# Patient Record
Sex: Male | Born: 1950 | Race: White | Hispanic: No | Marital: Married | Smoking: Current some day smoker
Health system: Southern US, Community
[De-identification: ages and names within clinical notes are randomized; demographics above are authoritative.]

## PROBLEM LIST (undated history)

## (undated) DIAGNOSIS — I9589 Other hypotension: ICD-10-CM

## (undated) DIAGNOSIS — K2100 Gastro-esophageal reflux disease with esophagitis, without bleeding: Secondary | ICD-10-CM

## (undated) DIAGNOSIS — I739 Peripheral vascular disease, unspecified: Secondary | ICD-10-CM

## (undated) DIAGNOSIS — F411 Generalized anxiety disorder: Secondary | ICD-10-CM

## (undated) DIAGNOSIS — K219 Gastro-esophageal reflux disease without esophagitis: Secondary | ICD-10-CM

## (undated) DIAGNOSIS — M7651 Patellar tendinitis, right knee: Secondary | ICD-10-CM

## (undated) HISTORY — DX: Gastro-esophageal reflux disease with esophagitis: K21.0

## (undated) HISTORY — PX: MASTOID DEBRIDEMENT: SHX2002

## (undated) HISTORY — DX: Peripheral vascular disease, unspecified: I73.9

## (undated) HISTORY — DX: Patellar tendinitis, right knee: M76.51

## (undated) HISTORY — DX: Generalized anxiety disorder: F41.1

## (undated) HISTORY — DX: Gastro-esophageal reflux disease with esophagitis, without bleeding: K21.00

---

## 2004-03-01 ENCOUNTER — Emergency Department (HOSPITAL_COMMUNITY): Admission: EM | Admit: 2004-03-01 | Discharge: 2004-03-01 | Payer: Self-pay | Admitting: Emergency Medicine

## 2016-11-25 MED ORDER — ESCITALOPRAM OXALATE 10 MG PO TAB
ORAL_TABLET | 11 refills | Status: DC
Start: 2016-11-25 — End: 2018-01-20

## 2017-01-04 ENCOUNTER — Encounter: Admit: 2017-01-04 | Discharge: 2017-01-04 | Payer: MEDICARE

## 2017-01-04 ENCOUNTER — Ambulatory Visit: Admit: 2017-01-04 | Discharge: 2017-01-04 | Payer: No Typology Code available for payment source

## 2017-01-04 DIAGNOSIS — G47419 Narcolepsy without cataplexy: Principal | ICD-10-CM

## 2017-01-04 DIAGNOSIS — R079 Chest pain, unspecified: ICD-10-CM

## 2017-01-04 DIAGNOSIS — R0600 Dyspnea, unspecified: ICD-10-CM

## 2017-01-04 DIAGNOSIS — E6609 Other obesity due to excess calories: ICD-10-CM

## 2017-01-04 DIAGNOSIS — N200 Calculus of kidney: ICD-10-CM

## 2017-01-04 DIAGNOSIS — R51 Headache: ICD-10-CM

## 2017-01-04 DIAGNOSIS — R1319 Other dysphagia: ICD-10-CM

## 2017-01-04 DIAGNOSIS — M1990 Unspecified osteoarthritis, unspecified site: ICD-10-CM

## 2017-01-04 DIAGNOSIS — G4733 Obstructive sleep apnea (adult) (pediatric): ICD-10-CM

## 2017-01-04 DIAGNOSIS — D689 Coagulation defect, unspecified: ICD-10-CM

## 2017-01-04 DIAGNOSIS — G43909 Migraine, unspecified, not intractable, without status migrainosus: ICD-10-CM

## 2017-01-04 DIAGNOSIS — K219 Gastro-esophageal reflux disease without esophagitis: ICD-10-CM

## 2017-01-04 DIAGNOSIS — G479 Sleep disorder, unspecified: ICD-10-CM

## 2017-01-04 DIAGNOSIS — R42 Dizziness and giddiness: ICD-10-CM

## 2017-01-04 DIAGNOSIS — E785 Hyperlipidemia, unspecified: ICD-10-CM

## 2017-01-04 MED ORDER — AMPHETAMINE SULFATE 10 MG PO TAB
1 | ORAL_TABLET | Freq: Every day | ORAL | 0 refills | Status: AC
Start: 2017-01-04 — End: 2017-01-04

## 2017-01-04 MED ORDER — AMPHETAMINE SULFATE 10 MG PO TAB
1 | ORAL_TABLET | Freq: Every day | ORAL | 0 refills | Status: AC
Start: 2017-01-04 — End: 2017-03-10

## 2017-01-04 NOTE — Progress Notes
Date of Service: 01/04/2017    Subjective:             Derrick King is a 66 y.o. male.    History of Present Illness    Last September was in [place] and needed more pressure.      BT 9-1 SOL no trouble  Wake up to 9 am.  Best sleeping time is 5 am - 10 am.  Awakenings couple times  Napping during the day, sometimes.  Dextrostat upon awakening 1 AM, then 2.5 hours alter second one,    Ambetter is for 3 months then on Medicare.         Review of Systems   Constitutional: Positive for fatigue.   HENT: Positive for congestion.    Respiratory: Positive for apnea and cough.    Neurological: Positive for numbness.   All other systems reviewed and are negative.        Objective:         ??? amphetamine sulfate (EVEKEO) 10 mg tab Take 1 tablet by mouth daily   ??? aspirin/acetaminophen/caffeine(+) (EXCEDRIN MIGRAINE) 250/250/65 mg tab Take 1 Tab by mouth every 8 hours as needed.   ??? atorvastatin (LIPITOR) 20 mg tablet Take 20 mg by mouth every 48 hours. at Bedtime   ??? Cholecalciferol (Vitamin D3) (VITAMIN D-3) 2,000 unit cap Take 2,000 Units by mouth daily.   ??? dextroamphetamine(+) (DEXTROSTAT) 10 mg tablet Take 1 tablet by mouth five times daily Earliest Fill Date: 12/24/16   ??? escitalopram oxalate (LEXAPRO) 10 mg tablet 1 po q day   ??? LORATADINE/PSEUDOEPHEDRINE (CLARITIN-D 24 HOUR PO) Take 1 Tab by mouth at bedtime daily.   ??? montelukast (SINGULAIR) 10 mg tablet Take 10 mg by mouth at bedtime daily.   ??? omeprazole DR(+) (PRILOSEC) 20 mg PO capsule Take 20 mg by mouth daily before dinner.   ??? phenylephrine (NEO-SYNEPHRINE) 0.25 % nasal spray Apply 2 Sprays to each nostril as directed every 6 hours as needed.   ??? tamsulosin (FLOMAX) 0.4 mg capsule Take 1 Cap by mouth daily. Take 30 min after same meal.  Do not cut/ crush/ chew.     Vitals:    01/04/17 1349   BP: 137/75   Pulse: 72   Weight: 121.1 kg (267 lb)   Height: 185.4 cm (73)     Body mass index is 35.23 kg/m???.     Physical Exam  Nose; patent  OP: Malampati 2 Neck: Neck supple.   Cardiovascular: Normal rate and regular rhythm.    Pulmonary/Chest: Effort normal and breath sounds normal.   Musculoskeletal: No edema.   Neurological: alert.   Skin: Skin is warm and dry.   Psychiatric: Normal mood and affect.         Assessment and Plan:    Problem   Narcolepsy    06/2015 mean latency 7 min, (4.6 on 4 naps), 1 SOREM on 2nd nap  Noted 05/2015 MSLT 18 min, no REM, PSG CPAP 10 cm, REM lat 65 min  ESS of 11 from 20 while on stimulants  Long hx of EDS, failed Nuvigil, Provigil, Adderall (80 mg),Amphetamine lingual dyskinesias, myoclonic jerks/tics  No sleep paralysis or hallucinations. Denies cataplexy but knees weak at times in the afternoon  No family hx    Noted 05/2015 MSLT 18 min, no REM, PSG CPAP 10 cm, REM lat 65 min, No sleep paralysis or hallucinations. Denies cataplexy but knees weak at times in the afternoon.No family hx of Narcolepsy.    -  ESS of 20-->11 while on stimulants  - Long hx of EDS, failed Nuvigil, Provigil, Adderall (80 mg),Amphetamine lingual dyskinesias, myoclonic jerks/tics--> some improvement on Concerta (Methyphenidate CR 36mg  BID )        Obesity    Discussed patient's BMI with him.  The body mass index is 34.84 kg/(m^2). and falls within the category of Obesity 2 (35 to <40); counseled regarding weight gain.     Osa On Cpap    Diagnosed in 2011  Titration study in 2014 with AHI of 79, Sleep efficeincy of 82%, REM latency 93 mins, No PLMD's  Titrated to 10 cm H20    Machine: [employer]   DME: [facility] in [place]          Narcolepsy  Dexedrine 10 mg up to 5 per day is only modestly improving alertness.  He is unable to afford long acting meds with his current insurance.  He feels this is a trend that things work for a while then stop working.  We will try Evekeo.  Discussed risks/benefits/SE/alternatives.  He tolerated Adderall before and this is similar.  He was given coupons and prescription enough for 30 days taking bid. Another consideration would be phentermine as a stimulant and weight loss agent.          OSA on CPAP  The patient continues to benefit from CPAP with more consolidated sleep, elimination of snoring and improved daytime alertness.    We discussed care and maintenance of machine, mask, accessories.  This includes:  Every month: filters and mask liners (if appropriate)  Every 3 months new mask  Every 6 months new headgear    No download to review.  Will request one from DME for review to ensure compliance and effective treatment.    Obesity  Discussed patient's BMI with him.  The body mass index is 35.23 kg/m???. and falls within the category of Obesity 1 (30  to <35); counseled regarding weight loss.  The patient is aware of the association between obesity and sleep apnea.    Depression Screening was performed on Decota Rudge in clinic today. Based on the score of 1, no follow up action or recommendations are necessary at this time.  The patient's Epworth Sleepiness Scale Score is 12/24.  STOPBANG Score: 6  If score > 3 there is a high probability that they have OSA.

## 2017-01-07 NOTE — Assessment & Plan Note
Discussed patient's BMI with him.  The body mass index is 35.23 kg/m. and falls within the category of Obesity 1 (30  to <35); counseled regarding weight loss.  The patient is aware of the association between obesity and sleep apnea.

## 2017-01-07 NOTE — Assessment & Plan Note
The patient continues to benefit from CPAP with more consolidated sleep, elimination of snoring and improved daytime alertness.    We discussed care and maintenance of machine, mask, accessories.  This includes:  Every month: filters and mask liners (if appropriate)  Every 3 months new mask  Every 6 months new headgear    No download to review.  Will request one from DME for review to ensure compliance and effective treatment.

## 2017-03-09 ENCOUNTER — Encounter: Admit: 2017-03-09 | Discharge: 2017-03-09 | Payer: MEDICARE

## 2017-03-09 MED ORDER — DEXTROAMPHETAMINE 10 MG PO TAB
10 mg | ORAL_TABLET | Freq: Every day | ORAL | 0 refills | Status: AC
Start: 2017-03-09 — End: 2017-03-10

## 2017-03-09 MED ORDER — DEXTROAMPHETAMINE 10 MG PO TAB
10 mg | ORAL_TABLET | Freq: Every day | ORAL | 0 refills | Status: AC
Start: 2017-03-09 — End: 2017-03-09

## 2017-03-09 NOTE — Telephone Encounter
Dr. Andria MeuseStevens wants to know if pt ever received the CPAP 10 cmH2O when we sent the order to [facility] on July 25th, 2016.  [facility] message to [place] [facility]. We will need to get a download either way.  [facility] old [facility] [place].    Theodoro Gristave at [facility] replied and said pt did pick up CPAP machine on 03/11/15.  Theodoro GristDave put pt in [employer] so we can see his data. Let Dr. Andria MeuseStevens know and also gave her 30 day download.

## 2017-03-10 ENCOUNTER — Encounter: Admit: 2017-03-10 | Discharge: 2017-03-10 | Payer: MEDICARE

## 2017-03-10 MED ORDER — DEXTROAMPHETAMINE 10 MG PO TAB
10 mg | ORAL_TABLET | Freq: Every day | ORAL | 0 refills | Status: AC
Start: 2017-03-10 — End: 2017-03-10

## 2017-03-10 MED ORDER — DEXTROAMPHETAMINE 10 MG PO TAB
10 mg | ORAL_TABLET | Freq: Every day | ORAL | 0 refills | Status: AC
Start: 2017-03-10 — End: 2017-07-22

## 2017-03-10 NOTE — Telephone Encounter
Derrick King tried to fill Rx for dextrostat 10 mg 1 tablet five times a day. Not printing out.  I printed 3 month out scripts for med.  Will mail out Rx to pt home address.

## 2017-07-22 ENCOUNTER — Encounter: Admit: 2017-07-22 | Discharge: 2017-07-22 | Payer: MEDICARE

## 2017-07-22 MED ORDER — DEXTROAMPHETAMINE 10 MG PO TAB
10 mg | ORAL_TABLET | Freq: Every day | ORAL | 0 refills | Status: AC
Start: 2017-07-22 — End: 2017-07-22

## 2017-07-22 MED ORDER — DEXTROAMPHETAMINE 10 MG PO TAB
10 mg | ORAL_TABLET | Freq: Every day | ORAL | 0 refills | Status: AC
Start: 2017-07-22 — End: 2017-09-06

## 2017-07-22 NOTE — Telephone Encounter
Refill request for Dextroamphetamine.  Per protocol refill approved and prescriptions will be mailed to Derrick King on Monday after Dr Andria MeuseStevens signs them. Derrick King notified.

## 2017-08-09 DIAGNOSIS — I7389 Other specified peripheral vascular diseases: Secondary | ICD-10-CM | POA: Diagnosis not present

## 2017-08-09 DIAGNOSIS — Z125 Encounter for screening for malignant neoplasm of prostate: Secondary | ICD-10-CM | POA: Diagnosis not present

## 2017-08-09 DIAGNOSIS — F411 Generalized anxiety disorder: Secondary | ICD-10-CM | POA: Diagnosis not present

## 2017-08-09 DIAGNOSIS — M7651 Patellar tendinitis, right knee: Secondary | ICD-10-CM | POA: Diagnosis not present

## 2017-08-09 DIAGNOSIS — K210 Gastro-esophageal reflux disease with esophagitis: Secondary | ICD-10-CM | POA: Diagnosis not present

## 2017-08-09 DIAGNOSIS — E7849 Other hyperlipidemia: Secondary | ICD-10-CM | POA: Diagnosis not present

## 2017-08-09 DIAGNOSIS — Z131 Encounter for screening for diabetes mellitus: Secondary | ICD-10-CM | POA: Diagnosis not present

## 2017-08-12 DIAGNOSIS — I7389 Other specified peripheral vascular diseases: Secondary | ICD-10-CM | POA: Diagnosis not present

## 2017-08-15 DIAGNOSIS — I739 Peripheral vascular disease, unspecified: Secondary | ICD-10-CM | POA: Diagnosis not present

## 2017-08-24 ENCOUNTER — Encounter: Payer: Self-pay | Admitting: Vascular Surgery

## 2017-09-06 ENCOUNTER — Ambulatory Visit: Admit: 2017-09-06 | Discharge: 2017-09-06 | Payer: MEDICARE

## 2017-09-06 ENCOUNTER — Encounter: Admit: 2017-09-06 | Discharge: 2017-09-06 | Payer: MEDICARE

## 2017-09-06 DIAGNOSIS — R1319 Other dysphagia: ICD-10-CM

## 2017-09-06 DIAGNOSIS — E785 Hyperlipidemia, unspecified: ICD-10-CM

## 2017-09-06 DIAGNOSIS — D689 Coagulation defect, unspecified: ICD-10-CM

## 2017-09-06 DIAGNOSIS — G4733 Obstructive sleep apnea (adult) (pediatric): ICD-10-CM

## 2017-09-06 DIAGNOSIS — I9589 Other hypotension: Principal | ICD-10-CM

## 2017-09-06 DIAGNOSIS — G43909 Migraine, unspecified, not intractable, without status migrainosus: ICD-10-CM

## 2017-09-06 DIAGNOSIS — R0600 Dyspnea, unspecified: ICD-10-CM

## 2017-09-06 DIAGNOSIS — K219 Gastro-esophageal reflux disease without esophagitis: ICD-10-CM

## 2017-09-06 DIAGNOSIS — R42 Dizziness and giddiness: ICD-10-CM

## 2017-09-06 DIAGNOSIS — R079 Chest pain, unspecified: ICD-10-CM

## 2017-09-06 DIAGNOSIS — M1990 Unspecified osteoarthritis, unspecified site: ICD-10-CM

## 2017-09-06 DIAGNOSIS — G479 Sleep disorder, unspecified: ICD-10-CM

## 2017-09-06 DIAGNOSIS — G47419 Narcolepsy without cataplexy: ICD-10-CM

## 2017-09-06 DIAGNOSIS — N200 Calculus of kidney: ICD-10-CM

## 2017-09-06 DIAGNOSIS — R51 Headache: ICD-10-CM

## 2017-09-06 MED ORDER — DEXTROAMPHETAMINE 10 MG PO TAB
10 mg | ORAL_TABLET | Freq: Every day | ORAL | 0 refills | Status: AC
Start: 2017-09-06 — End: 2018-08-15

## 2017-09-06 MED ORDER — DEXTROAMPHETAMINE 10 MG PO TAB
10 mg | ORAL_TABLET | Freq: Every day | ORAL | 0 refills | Status: AC
Start: 2017-09-06 — End: 2017-09-06

## 2017-09-29 ENCOUNTER — Encounter: Payer: Self-pay | Admitting: Vascular Surgery

## 2017-09-29 ENCOUNTER — Ambulatory Visit (INDEPENDENT_AMBULATORY_CARE_PROVIDER_SITE_OTHER): Payer: Medicare HMO | Admitting: Vascular Surgery

## 2017-09-29 VITALS — BP 120/61 | HR 73 | Temp 99.2°F | Resp 16 | Ht 69.0 in | Wt 183.0 lb

## 2017-09-29 DIAGNOSIS — I739 Peripheral vascular disease, unspecified: Secondary | ICD-10-CM | POA: Diagnosis not present

## 2017-09-29 DIAGNOSIS — I714 Abdominal aortic aneurysm, without rupture, unspecified: Secondary | ICD-10-CM

## 2017-09-29 NOTE — Progress Notes (Signed)
History of Present Illness:  Patient is a 67 y.o. year old male who presents for evaluation of thigh claudication L>R LE.  These symptoms started a few months ago with ambulation.  He could walk for about 15 min. Then stop and rest.  He reports no calf pain.  He denise history of non healing wounds and no family history of PAD. His primary care physician started him on Mobic daily and advised him to stop smoking.  He states the Mobic has helped and his thigh pain has gone away.     Past medical history includes: 50+ years of tobacco abuse, acid reflux and hypercholesterolemia managed with a statin.     Past Medical History:  Diagnosis Date  . Anxiety neurosis   . Patellar tendonitis of right knee   . Peripheral vascular disease (HCC)   . Reflux esophagitis     History reviewed. No pertinent surgical history.  ROS:   General:  No weight loss, Fever, chills  HEENT: No recent headaches, no nasal bleeding, no visual changes, no sore throat  Neurologic: No dizziness, blackouts, seizures. No recent symptoms of stroke or mini- stroke. No recent episodes of slurred speech, or temporary blindness.  Cardiac: No recent episodes of chest pain/pressure, no shortness of breath at rest.  No shortness of breath with exertion.  Denies history of atrial fibrillation or irregular heartbeat  Vascular: No history of rest pain in feet.  No history of claudication.  No history of non-healing ulcer, No history of DVT   Pulmonary: No home oxygen, no productive cough, no hemoptysis,  No asthma or wheezing  Musculoskeletal:  [ ]  Arthritis, [ ]  Low back pain,  [x ] Joint pain  Hematologic:No history of hypercoagulable state.  No history of easy bleeding.  No history of anemia  Gastrointestinal: No hematochezia or melena,  No gastroesophageal reflux, no trouble swallowing  Urinary: [ ]  chronic Kidney disease, [ ]  on HD - [ ]  MWF or [ ]  TTHS, [ ]  Burning with urination, [ ]  Frequent urination, [ ]   Difficulty urinating;   Skin: No rashes  Psychological: No history of anxiety,  No history of depression  Social History Social History   Tobacco Use  . Smoking status: Current Every Day Smoker    Packs/day: 1.00  . Smokeless tobacco: Never Used  Substance Use Topics  . Alcohol use: No    Frequency: Never  . Drug use: No    Family History History reviewed. No pertinent family history.  Allergies  No Known Allergies   Current Outpatient Medications  Medication Sig Dispense Refill  . atorvastatin (LIPITOR) 20 MG tablet Take 20 mg by mouth daily.    . busPIRone (BUSPAR) 15 MG tablet Take 15 mg by mouth 2 (two) times daily.    . meloxicam (MOBIC) 7.5 MG tablet Take 7.5 mg by mouth daily.    Marland Kitchen omeprazole (PRILOSEC) 20 MG capsule Take 20 mg by mouth daily.    . ranitidine (ZANTAC) 150 MG tablet Take 150 mg by mouth 2 (two) times daily.     No current facility-administered medications for this visit.     Physical Examination  Vitals:   09/29/17 1458  BP: 120/61  Pulse: 73  Resp: 16  Temp: 99.2 F (37.3 C)  TempSrc: Oral  SpO2: 97%  Weight: 183 lb (83 kg)  Height: 5\' 9"  (1.753 m)    Body mass index is 27.02 kg/m.  General:  Alert and oriented, no acute  distress HEENT: Normal Neck: No bruit or JVD Pulmonary: Clear to auscultation bilaterally Cardiac: Regular Rate and Rhythm without murmur Abdomen: Soft, non-tender, non-distended, no mass, no scars, easily palpable aortic pulse Skin: No rash Extremity Pulses:  2+ radial, brachial, femoral, dorsalis pedis, posterior tibial pulses, non palpable left pedal pulse and weak left femoral pulse. 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  He continues to smoke daily and is not interested in quitting right now.    If his symptoms return or increase we will schedule him for an angiogram to better view his arterial flow.  He will follow up in 6 months for repeat ABI's.     In the mean time we will have him return in 1 week for an aortic ultrasound to investigate his possible AAA and get a baseline measurement.     Mosetta PigeonEmma Maureen Collins PA-C [facility]  The patient was seen in conjunction with Dr. Darrick PennaFields today  History and exam findings as above.  I am unable to palpate his left femoral pulse.  He does have a very prominent aortic pulsation and most likely has an infrarenal abdominal aortic aneurysm.  It may be as large as 4 cm.  We will obtain an aortic ultrasound in the next few weeks to make sure that this is fully evaluated.  As far as his left thigh symptoms are concerned.  The patient is not currently interested in an intervention.  I discussed with him that if his thigh and buttock symptoms return that we could consider an arteriogram possible intervention but would also need to make sure that if he does have an aneurysm we take this into consideration as well.  He will follow-up with me after his aortic ultrasound.  It was emphasized to the patient today to try to quit smoking.  Fabienne Brunsharles Eleri Ruben, MD [facility] Office: [phone] Pager: [phone]

## 2017-09-30 ENCOUNTER — Other Ambulatory Visit: Payer: Self-pay

## 2017-09-30 ENCOUNTER — Encounter: Payer: Self-pay | Admitting: Internal Medicine

## 2017-09-30 DIAGNOSIS — I714 Abdominal aortic aneurysm, without rupture, unspecified: Secondary | ICD-10-CM

## 2017-09-30 DIAGNOSIS — I739 Peripheral vascular disease, unspecified: Secondary | ICD-10-CM

## 2017-10-04 ENCOUNTER — Ambulatory Visit (HOSPITAL_COMMUNITY)
Admission: RE | Admit: 2017-10-04 | Discharge: 2017-10-04 | Disposition: A | Discharge status: Home / Self Care | Payer: Medicare HMO | Source: Ambulatory Visit | Attending: Vascular Surgery | Admitting: Vascular Surgery

## 2017-10-04 DIAGNOSIS — F17200 Nicotine dependence, unspecified, uncomplicated: Secondary | ICD-10-CM | POA: Diagnosis not present

## 2017-10-04 DIAGNOSIS — R9389 Abnormal findings on diagnostic imaging of other specified body structures: Secondary | ICD-10-CM | POA: Insufficient documentation

## 2017-10-04 DIAGNOSIS — I714 Abdominal aortic aneurysm, without rupture, unspecified: Secondary | ICD-10-CM

## 2017-10-05 ENCOUNTER — Ambulatory Visit: Payer: Medicare HMO

## 2017-10-19 ENCOUNTER — Other Ambulatory Visit: Payer: Self-pay

## 2017-10-19 ENCOUNTER — Ambulatory Visit (INDEPENDENT_AMBULATORY_CARE_PROVIDER_SITE_OTHER): Payer: Medicare HMO | Admitting: Physician Assistant

## 2017-10-19 VITALS — BP 116/72 | HR 68 | Resp 20 | Ht 69.0 in | Wt 182.0 lb

## 2017-10-19 DIAGNOSIS — I714 Abdominal aortic aneurysm, without rupture, unspecified: Secondary | ICD-10-CM

## 2017-10-19 DIAGNOSIS — I739 Peripheral vascular disease, unspecified: Secondary | ICD-10-CM | POA: Diagnosis not present

## 2017-10-19 NOTE — Progress Notes (Signed)
  POST OPERATIVE OFFICE NOTE    CC:  F/u for surgery  HPI:  This is a 67 y.o. male who is here for a follow up visit .  He was first seen in our office for left LE thigh pain with abnormal ABI's.  Right 0.99 TBI 0.51, Left 0.48 TBI 0.34  His symptoms have resolved and he can walk as far as he wants without left thigh pain.  During his examination it was discovered that he had a palpable aortic pulse.  He was sent for an aortic duplex.  He is here today to review the results.    He reports no abdominal or lumbar pain.  His left thigh pain has resolved since he started daily Mobic prescribed by his PCP.  He denise fever, chills, N/V/D.   Past medical history includes: 50+ years of tobacco abuse, acid reflux, PAD, and hypercholesterolemia managed with a statin.      No Known Allergies  Current Outpatient Medications  Medication Sig Dispense Refill  . atorvastatin (LIPITOR) 20 MG tablet Take 20 mg by mouth daily.    . busPIRone (BUSPAR) 15 MG tablet Take 15 mg by mouth 2 (two) times daily.    . meloxicam (MOBIC) 7.5 MG tablet Take 7.5 mg by mouth daily.    Marland Kitchen. omeprazole (PRILOSEC) 20 MG capsule Take 20 mg by mouth daily.    . ranitidine (ZANTAC) 150 MG tablet Take 150 mg by mouth 2 (two) times daily.     No current facility-administered medications for this visit.      ROS:  See HPI  Physical Exam:  Vitals:   10/19/17 1435  BP: 116/72  Pulse: 68  Resp: 20  SpO2: 96%   General:  Alert and oriented, no acute distress HEENT: Normal Neck: No bruit or JVD Pulmonary: Clear to auscultation bilaterally Cardiac: Regular Rate and Rhythm without murmur Abdomen: Soft, non-tender, non-distended, no mass, no scars, easily palpable aortic pulse Skin: No rash Extremity Pulses:  2+ radial, brachial, femoral, dorsalis pedis, posterior tibial pulses, non palpable left pedal pulse and weak left femoral pulse. Musculoskeletal: No deformity or edema      Neurologic: Upper and lower extremity motor  5/5 and symmetric  His exam has not changed from previous visit.  Ultrasound 10/04/2017 Reveals AAA with max diameter of 6.0cm   Assessment/Plan:  This is a 67 y.o. male who is here for follow up of asymptomatic AAA duplex.  By Ultrasound he has a 6 cm AAA.  At this time we will schedule him for a CTA chest/Abdomin/Pelvis and referral to cardiologist for cardiac clearance in preporation for likely aortic aneurysm repair in the near future.  He is asymptomatic this was found on routine exam in our office 09/29/2017.   He will follow up with Dr. Darrick PennaFields after the CTA.   Mosetta PigeonEmma Maureen Charnel Giles PA-C [facility] [phone]  Clinic MD:  Imogene Burnhen

## 2017-10-20 ENCOUNTER — Other Ambulatory Visit: Payer: Self-pay

## 2017-10-20 DIAGNOSIS — I713 Abdominal aortic aneurysm, ruptured, unspecified: Secondary | ICD-10-CM

## 2017-11-01 ENCOUNTER — Other Ambulatory Visit: Payer: Self-pay

## 2017-11-01 DIAGNOSIS — Z01818 Encounter for other preprocedural examination: Secondary | ICD-10-CM

## 2017-11-04 ENCOUNTER — Encounter (HOSPITAL_COMMUNITY): Payer: Self-pay

## 2017-11-04 ENCOUNTER — Ambulatory Visit (HOSPITAL_COMMUNITY)
Admission: RE | Admit: 2017-11-04 | Discharge: 2017-11-04 | Disposition: A | Discharge status: Home / Self Care | Payer: Medicare HMO | Source: Ambulatory Visit | Attending: Vascular Surgery | Admitting: Vascular Surgery

## 2017-11-04 DIAGNOSIS — I2510 Atherosclerotic heart disease of native coronary artery without angina pectoris: Secondary | ICD-10-CM | POA: Diagnosis not present

## 2017-11-04 DIAGNOSIS — K449 Diaphragmatic hernia without obstruction or gangrene: Secondary | ICD-10-CM | POA: Diagnosis not present

## 2017-11-04 DIAGNOSIS — I723 Aneurysm of iliac artery: Secondary | ICD-10-CM | POA: Insufficient documentation

## 2017-11-04 DIAGNOSIS — J438 Other emphysema: Secondary | ICD-10-CM | POA: Diagnosis not present

## 2017-11-04 DIAGNOSIS — I745 Embolism and thrombosis of iliac artery: Secondary | ICD-10-CM | POA: Insufficient documentation

## 2017-11-04 DIAGNOSIS — I700 Atherosclerosis of aorta: Secondary | ICD-10-CM | POA: Diagnosis not present

## 2017-11-04 DIAGNOSIS — J432 Centrilobular emphysema: Secondary | ICD-10-CM | POA: Diagnosis not present

## 2017-11-04 DIAGNOSIS — I719 Aortic aneurysm of unspecified site, without rupture: Secondary | ICD-10-CM | POA: Diagnosis not present

## 2017-11-04 DIAGNOSIS — I713 Abdominal aortic aneurysm, ruptured, unspecified: Secondary | ICD-10-CM

## 2017-11-04 DIAGNOSIS — K5790 Diverticulosis of intestine, part unspecified, without perforation or abscess without bleeding: Secondary | ICD-10-CM | POA: Diagnosis not present

## 2017-11-04 DIAGNOSIS — K8020 Calculus of gallbladder without cholecystitis without obstruction: Secondary | ICD-10-CM | POA: Insufficient documentation

## 2017-11-04 DIAGNOSIS — I714 Abdominal aortic aneurysm, without rupture: Secondary | ICD-10-CM | POA: Diagnosis not present

## 2017-11-04 LAB — POCT I-STAT CREATININE: Creatinine, Ser: 1 mg/dL (ref 0.61–1.24)

## 2017-11-04 MED ORDER — IOPAMIDOL (ISOVUE-370) INJECTION 76%
100.0000 mL | Freq: Once | INTRAVENOUS | Status: AC | PRN
  Administered 2017-11-04: 100 mL via INTRAVENOUS

## 2017-11-07 ENCOUNTER — Encounter: Payer: Self-pay | Admitting: Cardiology

## 2017-11-07 ENCOUNTER — Encounter: Payer: Self-pay | Admitting: *Deleted

## 2017-11-07 ENCOUNTER — Ambulatory Visit (INDEPENDENT_AMBULATORY_CARE_PROVIDER_SITE_OTHER): Payer: Medicare HMO | Admitting: Cardiology

## 2017-11-07 VITALS — BP 140/88 | HR 62 | Ht 69.0 in | Wt 184.4 lb

## 2017-11-07 DIAGNOSIS — I714 Abdominal aortic aneurysm, without rupture, unspecified: Secondary | ICD-10-CM | POA: Insufficient documentation

## 2017-11-07 DIAGNOSIS — I2510 Atherosclerotic heart disease of native coronary artery without angina pectoris: Secondary | ICD-10-CM | POA: Diagnosis not present

## 2017-11-07 DIAGNOSIS — Z01810 Encounter for preprocedural cardiovascular examination: Secondary | ICD-10-CM

## 2017-11-07 NOTE — Progress Notes (Addendum)
     Clinical Summary Donald Poole is a 67 y.o.male seen as new consult, referred by Dr Jettie BoozeField for preoperative evaluation.   1. Preoperative evaluation - 09/2017 AAA US with 6 cm AAA - 10/2017 CTA with 6.5 cm AAA, PAD, LM and 3 vessel CAD - no chest pain. No significant SOB/DOE, though sedentary lifestyle   2. PAD - followed by vascular.     Past Medical History:  Diagnosis Date  . Anxiety neurosis   . Patellar tendonitis of right knee   . Peripheral vascular disease (HCC)   . Reflux esophagitis      No Known Allergies   Current Outpatient Medications  Medication Sig Dispense Refill  . atorvastatin (LIPITOR) 20 MG tablet Take 20 mg by mouth daily.    . busPIRone (BUSPAR) 15 MG tablet Take 15 mg by mouth 2 (two) times daily.    . meloxicam (MOBIC) 7.5 MG tablet Take 7.5 mg by mouth daily.    Marland Kitchen. omeprazole (PRILOSEC) 20 MG capsule Take 20 mg by mouth daily.    . ranitidine (ZANTAC) 150 MG tablet Take 150 mg by mouth 2 (two) times daily.     No current facility-administered medications for this visit.      No past surgical history on file.   No Known Allergies    No family history on file.   Social History Donald Poole reports that he has been smoking.  He has been smoking about 1.00 pack per day. He has never used smokeless tobacco. Donald Poole reports that he does not drink alcohol.   Review of Systems CONSTITUTIONAL: No weight loss, fever, chills, weakness or fatigue.  HEENT: Eyes: No visual loss, blurred vision, double vision or yellow sclerae.No hearing loss, sneezing, congestion, runny nose or sore throat.  SKIN: No rash or itching.  CARDIOVASCULAR: per hpi RESPIRATORY: per hpi GASTROINTESTINAL: No anorexia, nausea, vomiting or diarrhea. No abdominal pain or blood.  GENITOURINARY: No burning on urination, no polyuria NEUROLOGICAL: No headache, dizziness, syncope, paralysis, ataxia, numbness or tingling in the extremities. No change in bowel or bladder control.    MUSCULOSKELETAL: No muscle, back pain, joint pain or stiffness.  LYMPHATICS: No enlarged nodes. No history of splenectomy.  PSYCHIATRIC: No history of depression or anxiety.  ENDOCRINOLOGIC: No reports of sweating, cold or heat intolerance. No polyuria or polydipsia.  Marland Kitchen.   Physical Examination Vitals:   11/07/17 1427  BP: 140/88  Pulse: 62  SpO2: 98%   Vitals:   11/07/17 1427  Weight: 184 lb 6.4 oz (83.6 kg)  Height: 5\' 9"  (1.753 m)    Gen: resting comfortably, no acute distress HEENT: no scleral icterus, pupils equal round and reactive, no palptable cervical adenopathy,  CV: RRR, no m/r/g, no jvd Resp: Clear to auscultation bilaterally GI: abdomen is soft, non-tender, non-distended, normal bowel sounds, no hepatosplenomegaly MSK: extremities are warm, no edema.  Skin: warm, no rash Neuro:  no focal deficits Psych: appropriate affect    Assessment and Plan  1. Preoperative evaluation/CAD - being considered for vascular surgery - noted CAD and LM disease by recent CT scan. Unclear functionality of these lesions - we will plan for an exercise nuclear stress to further evaluate and better risk stratify for surgery - start ASA 81mg  daily.      11/16/17 Addendum Low risk stress test, recommend proceeding with surgery  Antoine PocheJonathan F. Edithe Dobbin, M.D.,

## 2017-11-07 NOTE — Patient Instructions (Signed)
Your physician recommends that you schedule a follow-up appointment in: [facility]  Your physician has recommended you make the following change in your medication:   START ASPRIN 81 MG DAILY  Your physician has requested that you have en exercise stress myoview. For further information please visit [email or web address]. Please follow instruction sheet, as given.  Thank you for choosing [facility]!!

## 2017-11-08 ENCOUNTER — Ambulatory Visit (INDEPENDENT_AMBULATORY_CARE_PROVIDER_SITE_OTHER): Payer: Medicare HMO | Admitting: Vascular Surgery

## 2017-11-08 ENCOUNTER — Encounter: Payer: Self-pay | Admitting: Vascular Surgery

## 2017-11-08 ENCOUNTER — Other Ambulatory Visit: Payer: Self-pay

## 2017-11-08 VITALS — BP 145/75 | HR 59 | Resp 20 | Ht 69.0 in | Wt 185.0 lb

## 2017-11-08 DIAGNOSIS — I714 Abdominal aortic aneurysm, without rupture, unspecified: Secondary | ICD-10-CM

## 2017-11-08 NOTE — H&P (View-Only) (Signed)
Patient name: Donald Poole MRN: [id number] DOB: Apr 09, 1951 Sex: male   HPI: Donald Poole is a 67 y.o. male who returns today for follow-up after CT Angio of the chest abdomen and pelvis.  It was done for further evaluation of a 6 cm abdominal aortic aneurysm.  Continues to deny any abdominal or back pain.  He was originally seen for left thigh claudication.  He continues to smoke but is considering quitting.  He was recently seen by cardiology and has a stress test scheduled for Monday, April 22.    Past Medical History:  Diagnosis Date  . Anxiety neurosis   . Patellar tendonitis of right knee   . Peripheral vascular disease (HCC)   . Reflux esophagitis    History reviewed. No pertinent surgical history.  History reviewed. No pertinent family history.  SOCIAL HISTORY: Social History   Socioeconomic History  . Marital status: Married    Spouse name: Not on file  . Number of children: Not on file  . Years of education: Not on file  . Highest education level: Not on file  Occupational History  . Not on file  Social Needs  . Financial resource strain: Not on file  . Food insecurity:    Worry: Not on file    Inability: Not on file  . Transportation needs:    Medical: Not on file    Non-medical: Not on file  Tobacco Use  . Smoking status: Current Every Day Smoker    Packs/day: 1.00  . Smokeless tobacco: Never Used  Substance and Sexual Activity  . Alcohol use: No    Frequency: Never  . Drug use: No  . Sexual activity: Not on file  Lifestyle  . Physical activity:    Days per week: Not on file    Minutes per session: Not on file  . Stress: Not on file  Relationships  . Social connections:    Talks on phone: Not on file    Gets together: Not on file    Attends religious service: Not on file    Active member of club or organization: Not on file    Attends meetings of clubs or organizations: Not on file    Relationship status: Not on file  . Intimate partner  violence:    Fear of current or ex partner: Not on file    Emotionally abused: Not on file    Physically abused: Not on file    Forced sexual activity: Not on file  Other Topics Concern  . Not on file  Social History Narrative  . Not on file    No Known Allergies  Current Outpatient Medications  Medication Sig Dispense Refill  . aspirin EC 81 MG tablet Take 81 mg by mouth daily.    Marland Kitchen atorvastatin (LIPITOR) 20 MG tablet Take 20 mg by mouth daily.    . busPIRone (BUSPAR) 15 MG tablet Take 15 mg by mouth 2 (two) times daily.    . meloxicam (MOBIC) 7.5 MG tablet Take 7.5 mg by mouth daily.    Marland Kitchen omeprazole (PRILOSEC) 20 MG capsule Take 20 mg by mouth daily.    . ranitidine (ZANTAC) 150 MG tablet Take 150 mg by mouth 2 (two) times daily.     No current facility-administered medications for this visit.     ROS:   General:  No weight loss, Fever, chills  HEENT: No recent headaches, no nasal bleeding, no visual changes, no sore throat  Neurologic: No dizziness, blackouts, seizures. No recent symptoms of stroke or mini- stroke. No recent episodes of slurred speech, or temporary blindness.  Cardiac: No recent episodes of chest pain/pressure, no shortness of breath at rest.  + shortness of breath with exertion.  Denies history of atrial fibrillation or irregular heartbeat  Vascular: No history of rest pain in feet.  + history of claudication.  No history of non-healing ulcer, No history of DVT   Pulmonary: No home oxygen, no productive cough, no hemoptysis,  No asthma or wheezing  Musculoskeletal:  [X]  Arthritis, [ ]  Low back pain,  [X]  Joint pain  Hematologic:No history of hypercoagulable state.  No history of easy bleeding.  No history of anemia  Gastrointestinal: No hematochezia or melena,  No gastroesophageal reflux, no trouble swallowing  Urinary: [ ]  chronic Kidney disease, [ ]  on HD - [ ]  MWF or [ ]  TTHS, [ ]  Burning with urination, [ ]  Frequent urination, [ ]  Difficulty  urinating;   Skin: No rashes  Psychological: + history of anxiety,  No history of depression   Physical Examination  Vitals:   11/08/17 0828 11/08/17 0829  BP: (!) 144/77 (!) 145/75  Pulse: (!) 59   Resp: 20   SpO2: 98%   Weight: 185 lb (83.9 kg)   Height: 5\' 9"  (1.753 m)     Body mass index is 27.32 kg/m.  General:  Alert and oriented, no acute distress HEENT: Normal Neck: No bruit or JVD Pulmonary: Clear to auscultation bilaterally Cardiac: Regular Rate and Rhythm without murmur Abdomen: Soft, non-tender, non-distended, vaguely palpable pulsatile mass, no scars Skin: No rash Extremity Pulses:  2+ radial, brachial, femoral, absent dorsalis pedis bilaterally, 2+ right absent left posterior tibial pulses bilaterally Musculoskeletal: No deformity or edema  Neurologic: Upper and lower extremity motor 5/5 and symmetric  DATA:  I reviewed the patient's CT Angio today.  Of note in his chest he had an presence of emphysema as well as coronary calcifications suggesting three-vessel possibly left main disease.  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  There appears to be enough clamp space above the renals and below the superior mesenteric artery.  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  The left common iliac is occluded in the left common femoral reconstitutes by collaterals.  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  On my review of the imaging I do not believe that he has a fenestrated stent graft candidate due to the tortuosity of the left renal.  He is not a candidate for standard infrarenal stent graft due to the short aortic neck.  I believe he is going to require standard operative open repair.  I discussed with him today that possibility.  He has a stress test  pending.  We will review the results of this.  I quoted him today 50% chance of transfusion perioperatively, 5% chance of cardiac events or death.  Other possibilities of postoperative renal failure or pneumonia.  Small overall risk of infection.   PLAN: Open repair of juxtarenal abdominal aortic aneurysm with plan to go to the right common iliac and left common femoral artery most likely not addressing the renal artery stenosis since his blood pressure is well controlled and he has no decline in renal function.  We  will schedule him for this as soon as we get the results of his stress test.  He will try to quit smoking at least 2 weeks prior to his operative repair so that his lungs are in better shape for the operation.   Fabienne Brunsharles Safiyah Cisney, MD [facility] Office: [phone] Pager: [phone]

## 2017-11-08 NOTE — Progress Notes (Signed)
Patient name: Donald Poole MRN: [id number] DOB: Apr 09, 1951 Sex: male   HPI: Donald Poole is a 67 y.o. male who returns today for follow-up after CT Angio of the chest abdomen and pelvis.  It was done for further evaluation of a 6 cm abdominal aortic aneurysm.  Continues to deny any abdominal or back pain.  He was originally seen for left thigh claudication.  He continues to smoke but is considering quitting.  He was recently seen by cardiology and has a stress test scheduled for Monday, April 22.    Past Medical History:  Diagnosis Date  . Anxiety neurosis   . Patellar tendonitis of right knee   . Peripheral vascular disease (HCC)   . Reflux esophagitis    History reviewed. No pertinent surgical history.  History reviewed. No pertinent family history.  SOCIAL HISTORY: Social History   Socioeconomic History  . Marital status: Married    Spouse name: Not on file  . Number of children: Not on file  . Years of education: Not on file  . Highest education level: Not on file  Occupational History  . Not on file  Social Needs  . Financial resource strain: Not on file  . Food insecurity:    Worry: Not on file    Inability: Not on file  . Transportation needs:    Medical: Not on file    Non-medical: Not on file  Tobacco Use  . Smoking status: Current Every Day Smoker    Packs/day: 1.00  . Smokeless tobacco: Never Used  Substance and Sexual Activity  . Alcohol use: No    Frequency: Never  . Drug use: No  . Sexual activity: Not on file  Lifestyle  . Physical activity:    Days per week: Not on file    Minutes per session: Not on file  . Stress: Not on file  Relationships  . Social connections:    Talks on phone: Not on file    Gets together: Not on file    Attends religious service: Not on file    Active member of club or organization: Not on file    Attends meetings of clubs or organizations: Not on file    Relationship status: Not on file  . Intimate partner  violence:    Fear of current or ex partner: Not on file    Emotionally abused: Not on file    Physically abused: Not on file    Forced sexual activity: Not on file  Other Topics Concern  . Not on file  Social History Narrative  . Not on file    No Known Allergies  Current Outpatient Medications  Medication Sig Dispense Refill  . aspirin EC 81 MG tablet Take 81 mg by mouth daily.    Marland Kitchen atorvastatin (LIPITOR) 20 MG tablet Take 20 mg by mouth daily.    . busPIRone (BUSPAR) 15 MG tablet Take 15 mg by mouth 2 (two) times daily.    . meloxicam (MOBIC) 7.5 MG tablet Take 7.5 mg by mouth daily.    Marland Kitchen omeprazole (PRILOSEC) 20 MG capsule Take 20 mg by mouth daily.    . ranitidine (ZANTAC) 150 MG tablet Take 150 mg by mouth 2 (two) times daily.     No current facility-administered medications for this visit.     ROS:   General:  No weight loss, Fever, chills  HEENT: No recent headaches, no nasal bleeding, no visual changes, no sore throat  Neurologic: No dizziness, blackouts, seizures. No recent symptoms of stroke or mini- stroke. No recent episodes of slurred speech, or temporary blindness.  Cardiac: No recent episodes of chest pain/pressure, no shortness of breath at rest.  + shortness of breath with exertion.  Denies history of atrial fibrillation or irregular heartbeat  Vascular: No history of rest pain in feet.  + history of claudication.  No history of non-healing ulcer, No history of DVT   Pulmonary: No home oxygen, no productive cough, no hemoptysis,  No asthma or wheezing  Musculoskeletal:  [X]  Arthritis, [ ]  Low back pain,  [X]  Joint pain  Hematologic:No history of hypercoagulable state.  No history of easy bleeding.  No history of anemia  Gastrointestinal: No hematochezia or melena,  No gastroesophageal reflux, no trouble swallowing  Urinary: [ ]  chronic Kidney disease, [ ]  on HD - [ ]  MWF or [ ]  TTHS, [ ]  Burning with urination, [ ]  Frequent urination, [ ]  Difficulty  urinating;   Skin: No rashes  Psychological: + history of anxiety,  No history of depression   Physical Examination  Vitals:   11/08/17 0828 11/08/17 0829  BP: (!) 144/77 (!) 145/75  Pulse: (!) 59   Resp: 20   SpO2: 98%   Weight: 185 lb (83.9 kg)   Height: 5\' 9"  (1.753 m)     Body mass index is 27.32 kg/m.  General:  Alert and oriented, no acute distress HEENT: Normal Neck: No bruit or JVD Pulmonary: Clear to auscultation bilaterally Cardiac: Regular Rate and Rhythm without murmur Abdomen: Soft, non-tender, non-distended, vaguely palpable pulsatile mass, no scars Skin: No rash Extremity Pulses:  2+ radial, brachial, femoral, absent dorsalis pedis bilaterally, 2+ right absent left posterior tibial pulses bilaterally Musculoskeletal: No deformity or edema  Neurologic: Upper and lower extremity motor 5/5 and symmetric  DATA:  I reviewed the patient's CT Angio today.  Of note in his chest he had an presence of emphysema as well as coronary calcifications suggesting three-vessel possibly left main disease.  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  There appears to be enough clamp space above the renals and below the superior mesenteric artery.  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  The left common iliac is occluded in the left common femoral reconstitutes by collaterals.  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  On my review of the imaging I do not believe that he has a fenestrated stent graft candidate due to the tortuosity of the left renal.  He is not a candidate for standard infrarenal stent graft due to the short aortic neck.  I believe he is going to require standard operative open repair.  I discussed with him today that possibility.  He has a stress test  pending.  We will review the results of this.  I quoted him today 50% chance of transfusion perioperatively, 5% chance of cardiac events or death.  Other possibilities of postoperative renal failure or pneumonia.  Small overall risk of infection.   PLAN: Open repair of juxtarenal abdominal aortic aneurysm with plan to go to the right common iliac and left common femoral artery most likely not addressing the renal artery stenosis since his blood pressure is well controlled and he has no decline in renal function.  We  will schedule him for this as soon as we get the results of his stress test.  He will try to quit smoking at least 2 weeks prior to his operative repair so that his lungs are in better shape for the operation.   Fabienne Brunsharles Amman Bartel, MD [facility] Office: [phone] Pager: [phone]

## 2017-11-10 DIAGNOSIS — E7849 Other hyperlipidemia: Secondary | ICD-10-CM | POA: Diagnosis not present

## 2017-11-10 DIAGNOSIS — K210 Gastro-esophageal reflux disease with esophagitis: Secondary | ICD-10-CM | POA: Diagnosis not present

## 2017-11-10 DIAGNOSIS — I7389 Other specified peripheral vascular diseases: Secondary | ICD-10-CM | POA: Diagnosis not present

## 2017-11-10 DIAGNOSIS — E781 Pure hyperglyceridemia: Secondary | ICD-10-CM | POA: Diagnosis not present

## 2017-11-10 DIAGNOSIS — F411 Generalized anxiety disorder: Secondary | ICD-10-CM | POA: Diagnosis not present

## 2017-11-10 DIAGNOSIS — M7651 Patellar tendinitis, right knee: Secondary | ICD-10-CM | POA: Diagnosis not present

## 2017-11-11 ENCOUNTER — Encounter: Payer: Self-pay | Admitting: Cardiology

## 2017-11-14 ENCOUNTER — Encounter (HOSPITAL_COMMUNITY): Payer: Self-pay

## 2017-11-14 ENCOUNTER — Encounter (HOSPITAL_BASED_OUTPATIENT_CLINIC_OR_DEPARTMENT_OTHER)
Admission: RE | Admit: 2017-11-14 | Discharge: 2017-11-14 | Disposition: A | Discharge status: Home / Self Care | Payer: Medicare HMO | Source: Ambulatory Visit | Attending: Cardiology | Admitting: Cardiology

## 2017-11-14 ENCOUNTER — Ambulatory Visit (HOSPITAL_COMMUNITY)
Admission: RE | Admit: 2017-11-14 | Discharge: 2017-11-14 | Disposition: A | Discharge status: Home / Self Care | Payer: Medicare HMO | Source: Ambulatory Visit | Attending: Cardiology | Admitting: Cardiology

## 2017-11-14 DIAGNOSIS — I2510 Atherosclerotic heart disease of native coronary artery without angina pectoris: Secondary | ICD-10-CM | POA: Insufficient documentation

## 2017-11-14 LAB — NM MYOCAR MULTI W/SPECT W/WALL MOTION / EF
CHL CUP MPHR: 155 {beats}/min
CHL CUP NUCLEAR SDS: 1
CHL CUP NUCLEAR SRS: 0
CHL CUP NUCLEAR SSS: 1
CSEPED: 6 min
CSEPEW: 7 METS
CSEPHR: 81 %
Exercise duration (sec): 16 s
LV dias vol: 90 mL (ref 62–150)
LV sys vol: 39 mL
NUC STRESS TID: 0.89
Peak HR: 126 {beats}/min
RATE: 0.47
RPE: 15
Rest HR: 51 {beats}/min

## 2017-11-14 MED ORDER — SODIUM CHLORIDE 0.9% FLUSH
INTRAVENOUS | Status: AC
  Administered 2017-11-14: 10 mL via INTRAVENOUS
  Filled 2017-11-14: qty 10

## 2017-11-14 MED ORDER — TECHNETIUM TC 99M TETROFOSMIN IV KIT
10.0000 | PACK | Freq: Once | INTRAVENOUS | Status: AC | PRN
  Administered 2017-11-14: 10.3 via INTRAVENOUS

## 2017-11-14 MED ORDER — REGADENOSON 0.4 MG/5ML IV SOLN
INTRAVENOUS | Status: AC
  Administered 2017-11-14: 0.4 mg via INTRAVENOUS
  Filled 2017-11-14: qty 5

## 2017-11-14 MED ORDER — TECHNETIUM TC 99M TETROFOSMIN IV KIT
30.0000 | PACK | Freq: Once | INTRAVENOUS | Status: AC | PRN
Start: 2017-11-14 — End: 2017-11-14
  Administered 2017-11-14: 30 via INTRAVENOUS

## 2017-11-16 ENCOUNTER — Ambulatory Visit (HOSPITAL_COMMUNITY): Payer: Medicare HMO

## 2017-11-21 ENCOUNTER — Other Ambulatory Visit: Payer: Self-pay | Admitting: *Deleted

## 2017-11-24 NOTE — Pre-Procedure Instructions (Signed)
Donald Poole  11/24/2017      [facility] - [place], [place] - [place], [place] - [address] [place] [address] Phone: [phone] Fax: [phone]    Your procedure is scheduled on May 8  Report to [facility] Admitting at [employer] A.M.  Call this number if you have problems the morning of surgery:  [phone]   Remember:  Do not eat food or drink liquids after midnight.    Take these medicines the morning of surgery with A SIP OF WATER  busPIRone (BUSPAR) omeprazole (PRILOSEC)   Aleve, Naproxen, Ibuprofen, Motrin, Advil, Goody's, BC's, all herbal medications, fish oil, and all vitamins   Do not wear jewelry, make-up or nail polish.  Do not wear lotions, powders, or perfumes, or deodorant.  Do not shave 48 hours prior to surgery.  Men may shave face and neck.  Do not bring valuables to the hospital.  [facility] is not responsible for any belongings or valuables.  Contacts, dentures or bridgework may not be worn into surgery.  Leave your suitcase in the car.  After surgery it may be brought to your room.  For patients admitted to the hospital, discharge time will be determined by your treatment team.  Patients discharged the day of surgery will not be allowed to drive home.    Special instructions:   [place]- Preparing For Surgery  Before surgery, you can play an important role. Because skin is not sterile, your skin needs to be as free of germs as possible. You can reduce the number of germs on your skin by washing with CHG (chlorahexidine gluconate) Soap before surgery.  CHG is an antiseptic cleaner which kills germs and bonds with the skin to continue killing germs even after washing.  Please do not use if you have an allergy to CHG or antibacterial soaps. If your skin becomes reddened/irritated stop using the CHG.  Do not shave (including legs and underarms) for at least 48 hours prior to first CHG shower. It is OK to shave your  face.  Please follow these instructions carefully.   1. Shower the NIGHT BEFORE SURGERY and the MORNING OF SURGERY with CHG.   2. If you chose to wash your hair, wash your hair first as usual with your normal shampoo.  3. After you shampoo, rinse your hair and body thoroughly to remove the shampoo.  4. Use CHG as you would any other liquid soap. You can apply CHG directly to the skin and wash gently with a scrungie or a clean washcloth.   5. Apply the CHG Soap to your body ONLY FROM THE NECK DOWN.  Do not use on open wounds or open sores. Avoid contact with your eyes, ears, mouth and genitals (private parts). Wash Face and genitals (private parts)  with your normal soap.  6. Wash thoroughly, paying special attention to the area where your surgery will be performed.  7. Thoroughly rinse your body with warm water from the neck down.  8. DO NOT shower/wash with your normal soap after using and rinsing off the CHG Soap.  9. Pat yourself dry with a CLEAN TOWEL.  10. Wear CLEAN PAJAMAS to bed the night before surgery, wear comfortable clothes the morning of surgery  11. Place CLEAN SHEETS on your bed the night of your first shower and DO NOT SLEEP WITH PETS.    Day of Surgery: Do not apply any deodorants/lotions. Please wear clean clothes  to the hospital/surgery center.      Please read over the following fact sheets that you were given.

## 2017-11-25 ENCOUNTER — Encounter: Admit: 2017-11-25 | Discharge: 2017-11-25 | Payer: MEDICARE

## 2017-11-25 ENCOUNTER — Ambulatory Visit: Admit: 2017-11-25 | Discharge: 2017-11-26 | Payer: MEDICARE

## 2017-11-25 ENCOUNTER — Encounter (HOSPITAL_COMMUNITY): Payer: Self-pay

## 2017-11-25 ENCOUNTER — Other Ambulatory Visit: Payer: Self-pay

## 2017-11-25 ENCOUNTER — Encounter (HOSPITAL_COMMUNITY)
Admission: RE | Admit: 2017-11-25 | Discharge: 2017-11-25 | Disposition: A | Discharge status: Home / Self Care | Payer: Medicare HMO | Source: Ambulatory Visit | Attending: Vascular Surgery | Admitting: Vascular Surgery

## 2017-11-25 DIAGNOSIS — R42 Dizziness and giddiness: ICD-10-CM

## 2017-11-25 DIAGNOSIS — G43909 Migraine, unspecified, not intractable, without status migrainosus: ICD-10-CM

## 2017-11-25 DIAGNOSIS — E785 Hyperlipidemia, unspecified: ICD-10-CM

## 2017-11-25 DIAGNOSIS — R1319 Other dysphagia: ICD-10-CM

## 2017-11-25 DIAGNOSIS — D689 Coagulation defect, unspecified: ICD-10-CM

## 2017-11-25 DIAGNOSIS — G479 Sleep disorder, unspecified: ICD-10-CM

## 2017-11-25 DIAGNOSIS — R0602 Shortness of breath: Secondary | ICD-10-CM

## 2017-11-25 DIAGNOSIS — K219 Gastro-esophageal reflux disease without esophagitis: ICD-10-CM

## 2017-11-25 DIAGNOSIS — M1990 Unspecified osteoarthritis, unspecified site: ICD-10-CM

## 2017-11-25 DIAGNOSIS — N200 Calculus of kidney: ICD-10-CM

## 2017-11-25 DIAGNOSIS — R079 Chest pain, unspecified: ICD-10-CM

## 2017-11-25 DIAGNOSIS — R51 Headache: ICD-10-CM

## 2017-11-25 DIAGNOSIS — G47419 Narcolepsy without cataplexy: ICD-10-CM

## 2017-11-25 DIAGNOSIS — R0600 Dyspnea, unspecified: ICD-10-CM

## 2017-11-25 DIAGNOSIS — I714 Abdominal aortic aneurysm, without rupture: Secondary | ICD-10-CM | POA: Insufficient documentation

## 2017-11-25 DIAGNOSIS — Z01818 Encounter for other preprocedural examination: Secondary | ICD-10-CM | POA: Diagnosis not present

## 2017-11-25 HISTORY — DX: Gastro-esophageal reflux disease without esophagitis: K21.9

## 2017-11-25 LAB — BLOOD GAS, ARTERIAL
Acid-Base Excess: 1 mmol/L (ref 0.0–2.0)
Bicarbonate: 24.6 mmol/L (ref 20.0–28.0)
DRAWN BY: 470591
FIO2: 21
O2 Saturation: 98.6 %
PCO2 ART: 36 mmHg (ref 32.0–48.0)
PH ART: 7.45 (ref 7.350–7.450)
Patient temperature: 98.6
pO2, Arterial: 130 mmHg — ABNORMAL HIGH (ref 83.0–108.0)

## 2017-11-25 LAB — URINALYSIS, ROUTINE W REFLEX MICROSCOPIC
Bilirubin Urine: NEGATIVE
Glucose, UA: NEGATIVE mg/dL
HGB URINE DIPSTICK: NEGATIVE
Ketones, ur: NEGATIVE mg/dL
Leukocytes, UA: NEGATIVE
Nitrite: NEGATIVE
PH: 5 (ref 5.0–8.0)
PROTEIN: NEGATIVE mg/dL
Specific Gravity, Urine: 1.024 (ref 1.005–1.030)

## 2017-11-25 LAB — SURGICAL PCR SCREEN
MRSA, PCR: NEGATIVE
Staphylococcus aureus: NEGATIVE

## 2017-11-25 LAB — ABO/RH: ABO/RH(D): O POS

## 2017-11-25 LAB — PREPARE RBC (CROSSMATCH)

## 2017-11-25 NOTE — Progress Notes (Signed)
CMET Hemolyzed per lab, PT, PTT and CBC clotted per lab. Orders placed for STAT DOS.

## 2017-11-26 DIAGNOSIS — G629 Polyneuropathy, unspecified: Principal | ICD-10-CM

## 2017-11-26 DIAGNOSIS — G9389 Other specified disorders of brain: ICD-10-CM

## 2017-11-26 DIAGNOSIS — I951 Orthostatic hypotension: ICD-10-CM

## 2017-11-26 DIAGNOSIS — R7989 Other specified abnormal findings of blood chemistry: ICD-10-CM

## 2017-11-26 DIAGNOSIS — R279 Unspecified lack of coordination: ICD-10-CM

## 2017-11-28 NOTE — Progress Notes (Signed)
Anesthesia Chart Review:  Case:  [id number] Date/Time:  2017/12/01 0815   Procedure:  AORTOBIFEMORAL BYPASS GRAFT (N/A )   Anesthesia type:  General   Pre-op diagnosis:  juxtarenal abdominal aortic aneursym   Location:  [facility] OR ROOM 11 / [facility] OR   Surgeon:  Sherren Kerns, MD      DISCUSSION: Patient is a 67 year old male scheduled for the above procedure. He is a smoker with PVD and AAA. He has cardiac clearance for surgery. Unfortunately, his CMET hemolyzed and PT/PTT and CBC clotted, so did not result from Friday 11/25/17 PAT visit. I have sent Dr. Evelina Dun a staff message regarding his preference on repeat lab attempt. Patient lives in [place], so could either see if labs could be done closer to home, see if he could come back to [facility], or either do STAT on the morning of surgery.   Pending labs acceptable and otherwise no acute changes then I would anticipate that he can proceed as planned.  VS: BP 125/63   Pulse 83   Temp 36.7 C   Resp 20   Ht  (1.753 m)   Wt 184 lb 8 oz (83.7 kg)   SpO2 96%   BMI 27.25 kg/m   PROVIDERS: Toma Deiters, MD is PCP.  He was referred to cardiologist Dina Rich, MD for preoperative evaluation. Following recent stress test, he wrote on 11/16/17, "Low risk stress test, recommend proceeding with surgery."   LABS: Per PAT RN notation, "CMET Hemolyzed per lab, PT, PTT and CBC clotted per lab. Orders placed for STAT DOS." I routed this to Dr. Darrick Penna and [facility] nursing staff to see if he is okay with labs on the day of surgery or if he would prefer patient to come in for repeat labs here or elsewhere.   (all labs ordered are listed, but only abnormal results are displayed)  Labs Reviewed  BLOOD GAS, ARTERIAL - Abnormal; Notable for the following components:      Result Value   pO2, Arterial 130 (*)    All other components within normal limits  SURGICAL PCR SCREEN  URINALYSIS, ROUTINE W REFLEX MICROSCOPIC  PREPARE RBC (CROSSMATCH)  TYPE AND SCREEN   ABO/RH     IMAGES: CTA chest/abd/pelvis 11/04/17: IMPRESSION: - Complex infrarenal abdominal aortic aneurysm measuring as large as 6.5 cm, with circumferential sac thrombus and reverse-tapered neck. Aortic aneurysm NOS (ICD10-I71.9). Aortic Atherosclerosis (ICD10-I70.0). - Left common iliac artery occlusion. Reconstitution of the left common femoral artery secondary to collateral flow as above, with moderate calcified disease of the common femoral artery. 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 Moderate atherosclerotic calcified changes of the right common femoral artery. - Atherosclerotic changes of the renal arteries, with estimated 50% stenosis bilaterally. - Left main and 3 vessel coronary artery disease. - Hiatal hernia. - Diverticular disease without evidence of acute diverticulitis. - Cholelithiasis without evidence of acute cholecystitis. - Centrilobular and paraseptal emphysema.  Emphysema (ICD10-J43.9).   EKG: 11/07/17: SB at 59 bpm  CV:  Nuclear stress test 11/14/17:  Unable to achieve adequate heart rate response on treadmill, stopped due to leg discomfort but no chest pain. Lexiscan utilized. Equivocal ST segment changes were noted. Occasional PVCs noted.  Blood pressure demonstrated a hypertensive response to exercise.  Small, mild intensity, inferior defect that exhibits partial reversibility. This is consistent with either variable diaphragmatic attenuation or a mild ischemic territory.  This is a low risk study.  Nuclear stress EF: 56%.  Results reviewed by cardiologist Dr. Dina Rich who wrote, "Low risk stress test, I have sent a notice to his vascualr surgeon to proceed with surgery."  Past Medical History:  Diagnosis Date  . Anxiety neurosis   . GERD (gastroesophageal reflux disease)   . Patellar tendonitis of right knee   . Peripheral vascular disease (HCC)   . Reflux esophagitis     Past Surgical History:  Procedure  Laterality Date  . MASTOID DEBRIDEMENT     "I had a mastoid"    MEDICATIONS: . aspirin EC 81 MG tablet  . atorvastatin (LIPITOR) 20 MG tablet  . busPIRone (BUSPAR) 7.5 MG tablet  . Doxylamine Succinate, Sleep, (SLEEP AID PO)  . Garlic 1000 MG CAPS  . Ginseng 250 MG CAPS  . meloxicam (MOBIC) 7.5 MG tablet  . Omega-3 Fatty Acids (FISH OIL) 1000 MG CAPS  . omeprazole (PRILOSEC) 20 MG capsule  . ranitidine (ZANTAC) 150 MG tablet  . sodium chloride (OCEAN) 0.65 % SOLN nasal spray  . vitamin B-12 (CYANOCOBALAMIN) 1000 MCG tablet  . vitamin E 400 UNIT capsule   No current facility-administered medications for this encounter.     Velna Ochs [facility] Short Stay Center/Anesthesiology Phone [phone] 11/28/2017 11:32 AM

## 2017-11-29 ENCOUNTER — Other Ambulatory Visit: Payer: Self-pay | Admitting: *Deleted

## 2017-11-29 ENCOUNTER — Other Ambulatory Visit (HOSPITAL_COMMUNITY)
Admission: RE | Admit: 2017-11-29 | Discharge: 2017-11-29 | Disposition: A | Discharge status: Home / Self Care | Payer: Medicare HMO | Source: Ambulatory Visit | Attending: Vascular Surgery | Admitting: Vascular Surgery

## 2017-11-29 DIAGNOSIS — N179 Acute kidney failure, unspecified: Secondary | ICD-10-CM | POA: Diagnosis not present

## 2017-11-29 DIAGNOSIS — I739 Peripheral vascular disease, unspecified: Secondary | ICD-10-CM | POA: Diagnosis not present

## 2017-11-29 DIAGNOSIS — E874 Mixed disorder of acid-base balance: Secondary | ICD-10-CM | POA: Diagnosis not present

## 2017-11-29 DIAGNOSIS — I9589 Other hypotension: Secondary | ICD-10-CM | POA: Diagnosis not present

## 2017-11-29 DIAGNOSIS — Z01818 Encounter for other preprocedural examination: Secondary | ICD-10-CM | POA: Diagnosis not present

## 2017-11-29 DIAGNOSIS — D696 Thrombocytopenia, unspecified: Secondary | ICD-10-CM | POA: Diagnosis present

## 2017-11-29 DIAGNOSIS — K567 Ileus, unspecified: Secondary | ICD-10-CM | POA: Diagnosis not present

## 2017-11-29 DIAGNOSIS — E785 Hyperlipidemia, unspecified: Secondary | ICD-10-CM | POA: Diagnosis present

## 2017-11-29 DIAGNOSIS — I714 Abdominal aortic aneurysm, without rupture: Secondary | ICD-10-CM | POA: Diagnosis present

## 2017-11-29 DIAGNOSIS — F411 Generalized anxiety disorder: Secondary | ICD-10-CM | POA: Diagnosis present

## 2017-11-29 DIAGNOSIS — E872 Acidosis: Secondary | ICD-10-CM | POA: Diagnosis not present

## 2017-11-29 DIAGNOSIS — F17210 Nicotine dependence, cigarettes, uncomplicated: Secondary | ICD-10-CM | POA: Diagnosis present

## 2017-11-29 DIAGNOSIS — J810 Acute pulmonary edema: Secondary | ICD-10-CM | POA: Diagnosis not present

## 2017-11-29 DIAGNOSIS — I959 Hypotension, unspecified: Secondary | ICD-10-CM | POA: Diagnosis not present

## 2017-11-29 DIAGNOSIS — I70202 Unspecified atherosclerosis of native arteries of extremities, left leg: Secondary | ICD-10-CM | POA: Diagnosis present

## 2017-11-29 DIAGNOSIS — E8770 Fluid overload, unspecified: Secondary | ICD-10-CM | POA: Diagnosis not present

## 2017-11-29 DIAGNOSIS — N170 Acute kidney failure with tubular necrosis: Secondary | ICD-10-CM | POA: Diagnosis present

## 2017-11-29 DIAGNOSIS — Z7982 Long term (current) use of aspirin: Secondary | ICD-10-CM | POA: Diagnosis not present

## 2017-11-29 DIAGNOSIS — D62 Acute posthemorrhagic anemia: Secondary | ICD-10-CM | POA: Diagnosis not present

## 2017-11-29 DIAGNOSIS — Z79899 Other long term (current) drug therapy: Secondary | ICD-10-CM | POA: Diagnosis not present

## 2017-11-29 DIAGNOSIS — R001 Bradycardia, unspecified: Secondary | ICD-10-CM | POA: Diagnosis not present

## 2017-11-29 DIAGNOSIS — I701 Atherosclerosis of renal artery: Secondary | ICD-10-CM | POA: Diagnosis present

## 2017-11-29 DIAGNOSIS — K219 Gastro-esophageal reflux disease without esophagitis: Secondary | ICD-10-CM | POA: Diagnosis present

## 2017-11-29 DIAGNOSIS — I722 Aneurysm of renal artery: Secondary | ICD-10-CM | POA: Diagnosis not present

## 2017-11-29 DIAGNOSIS — Z791 Long term (current) use of non-steroidal anti-inflammatories (NSAID): Secondary | ICD-10-CM | POA: Diagnosis not present

## 2017-11-29 DIAGNOSIS — I469 Cardiac arrest, cause unspecified: Secondary | ICD-10-CM | POA: Diagnosis not present

## 2017-11-29 DIAGNOSIS — G9341 Metabolic encephalopathy: Secondary | ICD-10-CM | POA: Diagnosis not present

## 2017-11-29 DIAGNOSIS — D6489 Other specified anemias: Secondary | ICD-10-CM | POA: Diagnosis present

## 2017-11-29 DIAGNOSIS — J9601 Acute respiratory failure with hypoxia: Secondary | ICD-10-CM | POA: Diagnosis not present

## 2017-11-29 DIAGNOSIS — R739 Hyperglycemia, unspecified: Secondary | ICD-10-CM | POA: Diagnosis present

## 2017-11-29 LAB — PROTIME-INR
INR: 0.97
Prothrombin Time: 12.8 seconds (ref 11.4–15.2)

## 2017-11-29 LAB — COMPREHENSIVE METABOLIC PANEL
ALBUMIN: 3.9 g/dL (ref 3.5–5.0)
ALT: 25 U/L (ref 17–63)
AST: 25 U/L (ref 15–41)
Alkaline Phosphatase: 92 U/L (ref 38–126)
Anion gap: 8 (ref 5–15)
BUN: 13 mg/dL (ref 6–20)
CHLORIDE: 103 mmol/L (ref 101–111)
CO2: 28 mmol/L (ref 22–32)
CREATININE: 0.97 mg/dL (ref 0.61–1.24)
Calcium: 9 mg/dL (ref 8.9–10.3)
GFR calc Af Amer: >60 mL/min (ref >60)
GFR calc non Af Amer: >60 mL/min (ref >60)
GLUCOSE: 81 mg/dL (ref 65–99)
Potassium: 4.6 mmol/L (ref 3.5–5.1)
SODIUM: 139 mmol/L (ref 135–145)
Total Bilirubin: 1 mg/dL (ref 0.3–1.2)
Total Protein: 6.8 g/dL (ref 6.5–8.1)

## 2017-11-29 LAB — CBC
HCT: 41.8 % (ref 39.0–52.0)
HEMOGLOBIN: 13.6 g/dL (ref 13.0–17.0)
MCH: 32.7 pg (ref 26.0–34.0)
MCHC: 32.5 g/dL (ref 30.0–36.0)
MCV: 100.5 fL — ABNORMAL HIGH (ref 78.0–100.0)
Platelets: 175 10*3/uL (ref 150–400)
RBC: 4.16 MIL/uL — AB (ref 4.22–5.81)
RDW: 13.3 % (ref 11.5–15.5)
WBC: 5.8 10*3/uL (ref 4.0–10.5)

## 2017-11-29 LAB — APTT: APTT: 32 s (ref 24–36)

## 2017-11-29 NOTE — Progress Notes (Signed)
Anesthesia follow-up: Since patient's CMET hemolyzed and PT/PTT and CBC clotted, Dr. Darrick Penna requested that labs be repeated prior to the day of surgery. I notified patient. He lives in [place] and wished to have labs redrawn in [place] instead of having to drive to [place]. [facility] faxed lab orders to [facility] and results as below and are acceptable for OR:  Results for orders placed or performed during the hospital encounter of 11/29/17 (from the past 24 hour(s))  CBC     Status: Abnormal   Collection Time: 11/29/17  2:18 PM  Result Value Ref Range   WBC 5.8 4.0 - 10.5 K/uL   RBC 4.16 (L) 4.22 - 5.81 MIL/uL   Hemoglobin 13.6 13.0 - 17.0 g/dL   HCT 21.3 08.6 - 57.8 %   MCV 100.5 (H) 78.0 - 100.0 fL   MCH 32.7 26.0 - 34.0 pg   MCHC 32.5 30.0 - 36.0 g/dL   RDW 46.9 62.9 - 52.8 %   Platelets 175 150 - 400 K/uL  Comprehensive Metabolic Panel (CMET)     Status: None   Collection Time: 11/29/17  2:18 PM  Result Value Ref Range   Sodium 139 135 - 145 mmol/L   Potassium 4.6 3.5 - 5.1 mmol/L   Chloride 103 101 - 111 mmol/L   CO2 28 22 - 32 mmol/L   Glucose, Bld 81 65 - 99 mg/dL   BUN 13 6 - 20 mg/dL   Creatinine, Ser 4.13 0.61 - 1.24 mg/dL   Calcium 9.0 8.9 - 24.4 mg/dL   Total Protein 6.8 6.5 - 8.1 g/dL   Albumin 3.9 3.5 - 5.0 g/dL   AST 25 15 - 41 U/L   ALT 25 17 - 63 U/L   Alkaline Phosphatase 92 38 - 126 U/L   Total Bilirubin 1.0 0.3 - 1.2 mg/dL   GFR calc non Af Amer >60 >60 mL/min   GFR calc Af Amer >60 >60 mL/min   Anion gap 8 5 - 15  INR/PT     Status: None   Collection Time: 11/29/17  2:18 PM  Result Value Ref Range   Prothrombin Time 12.8 11.4 - 15.2 seconds   INR 0.97   PTT     Status: None   Collection Time: 11/29/17  2:18 PM  Result Value Ref Range   aPTT 32 24 - 36 seconds   Velna Ochs [facility] Short Stay Center/Anesthesiology Phone [phone] 11/29/2017 3:17 PM

## 2017-11-30 ENCOUNTER — Inpatient Hospital Stay (HOSPITAL_COMMUNITY): Payer: Medicare HMO | Admitting: Vascular Surgery

## 2017-11-30 ENCOUNTER — Inpatient Hospital Stay (HOSPITAL_COMMUNITY)
Admission: RE | Admit: 2017-11-30 | Discharge: 2017-12-24 | DRG: 270 | Disposition: E | Payer: Medicare HMO | Source: Ambulatory Visit | Attending: Vascular Surgery | Admitting: Vascular Surgery

## 2017-11-30 ENCOUNTER — Encounter (HOSPITAL_COMMUNITY): Admission: RE | Disposition: E | Payer: Self-pay | Source: Ambulatory Visit | Attending: Vascular Surgery

## 2017-11-30 ENCOUNTER — Inpatient Hospital Stay (HOSPITAL_COMMUNITY): Payer: Medicare HMO

## 2017-11-30 ENCOUNTER — Encounter (HOSPITAL_COMMUNITY): Payer: Self-pay | Admitting: Certified Registered Nurse Anesthetist

## 2017-11-30 DIAGNOSIS — F411 Generalized anxiety disorder: Secondary | ICD-10-CM | POA: Diagnosis present

## 2017-11-30 DIAGNOSIS — J9601 Acute respiratory failure with hypoxia: Secondary | ICD-10-CM | POA: Diagnosis not present

## 2017-11-30 DIAGNOSIS — Z7982 Long term (current) use of aspirin: Secondary | ICD-10-CM

## 2017-11-30 DIAGNOSIS — Z791 Long term (current) use of non-steroidal anti-inflammatories (NSAID): Secondary | ICD-10-CM

## 2017-11-30 DIAGNOSIS — I9589 Other hypotension: Secondary | ICD-10-CM | POA: Diagnosis not present

## 2017-11-30 DIAGNOSIS — K219 Gastro-esophageal reflux disease without esophagitis: Secondary | ICD-10-CM | POA: Diagnosis present

## 2017-11-30 DIAGNOSIS — I70202 Unspecified atherosclerosis of native arteries of extremities, left leg: Secondary | ICD-10-CM | POA: Diagnosis present

## 2017-11-30 DIAGNOSIS — Z79899 Other long term (current) drug therapy: Secondary | ICD-10-CM | POA: Diagnosis not present

## 2017-11-30 DIAGNOSIS — N170 Acute kidney failure with tubular necrosis: Secondary | ICD-10-CM | POA: Diagnosis present

## 2017-11-30 DIAGNOSIS — R001 Bradycardia, unspecified: Secondary | ICD-10-CM | POA: Diagnosis not present

## 2017-11-30 DIAGNOSIS — R739 Hyperglycemia, unspecified: Secondary | ICD-10-CM | POA: Diagnosis present

## 2017-11-30 DIAGNOSIS — Z978 Presence of other specified devices: Secondary | ICD-10-CM

## 2017-11-30 DIAGNOSIS — K567 Ileus, unspecified: Secondary | ICD-10-CM | POA: Diagnosis not present

## 2017-11-30 DIAGNOSIS — I701 Atherosclerosis of renal artery: Secondary | ICD-10-CM | POA: Diagnosis present

## 2017-11-30 DIAGNOSIS — N179 Acute kidney failure, unspecified: Secondary | ICD-10-CM | POA: Diagnosis not present

## 2017-11-30 DIAGNOSIS — I714 Abdominal aortic aneurysm, without rupture, unspecified: Secondary | ICD-10-CM | POA: Diagnosis present

## 2017-11-30 DIAGNOSIS — E8770 Fluid overload, unspecified: Secondary | ICD-10-CM | POA: Diagnosis not present

## 2017-11-30 DIAGNOSIS — I722 Aneurysm of renal artery: Secondary | ICD-10-CM | POA: Diagnosis not present

## 2017-11-30 DIAGNOSIS — E874 Mixed disorder of acid-base balance: Secondary | ICD-10-CM | POA: Diagnosis not present

## 2017-11-30 DIAGNOSIS — J9690 Respiratory failure, unspecified, unspecified whether with hypoxia or hypercapnia: Secondary | ICD-10-CM

## 2017-11-30 DIAGNOSIS — J810 Acute pulmonary edema: Secondary | ICD-10-CM | POA: Diagnosis not present

## 2017-11-30 DIAGNOSIS — D62 Acute posthemorrhagic anemia: Secondary | ICD-10-CM | POA: Diagnosis not present

## 2017-11-30 DIAGNOSIS — I469 Cardiac arrest, cause unspecified: Secondary | ICD-10-CM | POA: Diagnosis not present

## 2017-11-30 DIAGNOSIS — D696 Thrombocytopenia, unspecified: Secondary | ICD-10-CM | POA: Diagnosis present

## 2017-11-30 DIAGNOSIS — E785 Hyperlipidemia, unspecified: Secondary | ICD-10-CM | POA: Diagnosis present

## 2017-11-30 DIAGNOSIS — D6489 Other specified anemias: Secondary | ICD-10-CM | POA: Diagnosis present

## 2017-11-30 DIAGNOSIS — G9341 Metabolic encephalopathy: Secondary | ICD-10-CM | POA: Diagnosis not present

## 2017-11-30 DIAGNOSIS — R0682 Tachypnea, not elsewhere classified: Secondary | ICD-10-CM

## 2017-11-30 DIAGNOSIS — F17210 Nicotine dependence, cigarettes, uncomplicated: Secondary | ICD-10-CM | POA: Diagnosis present

## 2017-11-30 HISTORY — PX: ABDOMINAL AORTIC ANEURYSM REPAIR: SHX42

## 2017-11-30 HISTORY — PX: ENDARTERECTOMY FEMORAL: SHX5804

## 2017-11-30 HISTORY — PX: AORTA - BILATERAL FEMORAL ARTERY BYPASS GRAFT: SHX1175

## 2017-11-30 LAB — PROTIME-INR
INR: 1.28
PROTHROMBIN TIME: 15.9 s — AB (ref 11.4–15.2)

## 2017-11-30 LAB — BASIC METABOLIC PANEL
ANION GAP: 9 (ref 5–15)
ANION GAP: 8 (ref 5–15)
BUN: 14 mg/dL (ref 6–20)
BUN: 19 mg/dL (ref 6–20)
CALCIUM: 7.7 mg/dL — AB (ref 8.9–10.3)
CHLORIDE: 108 mmol/L (ref 101–111)
CO2: 21 mmol/L — ABNORMAL LOW (ref 22–32)
CO2: 22 mmol/L (ref 22–32)
CREATININE: 1.58 mg/dL — AB (ref 0.61–1.24)
Calcium: 7.6 mg/dL — ABNORMAL LOW (ref 8.9–10.3)
Chloride: 108 mmol/L (ref 101–111)
Creatinine, Ser: 2.03 mg/dL — ABNORMAL HIGH (ref 0.61–1.24)
GFR calc non Af Amer: 44 mL/min — ABNORMAL LOW (ref >60)
GFR, EST AFRICAN AMERICAN: 51 mL/min — AB (ref >60)
GFR, EST AFRICAN AMERICAN: 38 mL/min — AB (ref >60)
GFR, EST NON AFRICAN AMERICAN: 33 mL/min — AB (ref >60)
GLUCOSE: 170 mg/dL — AB (ref 65–99)
Glucose, Bld: 183 mg/dL — ABNORMAL HIGH (ref 65–99)
POTASSIUM: 5.3 mmol/L — AB (ref 3.5–5.1)
Potassium: 4.7 mmol/L (ref 3.5–5.1)
SODIUM: 138 mmol/L (ref 135–145)
Sodium: 138 mmol/L (ref 135–145)

## 2017-11-30 LAB — CBC
HCT: 37.5 % — ABNORMAL LOW (ref 39.0–52.0)
HEMOGLOBIN: 12.3 g/dL — AB (ref 13.0–17.0)
MCH: 32.5 pg (ref 26.0–34.0)
MCHC: 32.8 g/dL (ref 30.0–36.0)
MCV: 99.2 fL (ref 78.0–100.0)
Platelets: 140 10*3/uL — ABNORMAL LOW (ref 150–400)
RBC: 3.78 MIL/uL — AB (ref 4.22–5.81)
RDW: 13.4 % (ref 11.5–15.5)
WBC: 15.9 10*3/uL — AB (ref 4.0–10.5)

## 2017-11-30 LAB — APTT: APTT: 29 s (ref 24–36)

## 2017-11-30 LAB — MAGNESIUM: MAGNESIUM: 1.6 mg/dL — AB (ref 1.7–2.4)

## 2017-11-30 LAB — PREPARE RBC (CROSSMATCH)

## 2017-11-30 SURGERY — CREATION, BYPASS, ARTERIAL, AORTA TO FEMORAL, BILATERAL, USING GRAFT
Anesthesia: General | Site: Groin | Laterality: Left

## 2017-11-30 MED ORDER — ONDANSETRON HCL 4 MG/2ML IJ SOLN
4.0000 mg | Freq: Four times a day (QID) | INTRAMUSCULAR | Status: DC | PRN
  Administered 2017-11-30: 4 mg via INTRAVENOUS
  Filled 2017-11-30: qty 2

## 2017-11-30 MED ORDER — SODIUM BICARBONATE 8.4 % IV SOLN
INTRAVENOUS | Status: DC | PRN
  Administered 2017-11-30: 25 meq via INTRAVENOUS

## 2017-11-30 MED ORDER — ONDANSETRON HCL 4 MG/2ML IJ SOLN
INTRAMUSCULAR | Status: DC | PRN
  Administered 2017-11-30: 4 mg via INTRAVENOUS

## 2017-11-30 MED ORDER — DOCUSATE SODIUM 100 MG PO CAPS: 100.0000 mg | ORAL_CAPSULE | Freq: Every day | ORAL | Status: DC

## 2017-11-30 MED ORDER — PROTAMINE SULFATE 10 MG/ML IV SOLN
INTRAVENOUS | Status: AC
  Filled 2017-11-30: qty 5

## 2017-11-30 MED ORDER — SODIUM CHLORIDE 0.9 % IV SOLN
INTRAVENOUS | Status: DC | PRN
  Administered 2017-11-30: 09:00:00

## 2017-11-30 MED ORDER — MIDAZOLAM HCL 5 MG/5ML IJ SOLN
INTRAMUSCULAR | Status: DC | PRN
  Administered 2017-11-30 (×2): 1 mg via INTRAVENOUS

## 2017-11-30 MED ORDER — SODIUM CHLORIDE 0.9 % IV SOLN
INTRAVENOUS | Status: DC | PRN
  Administered 2017-11-30: 14:00:00 via INTRAVENOUS

## 2017-11-30 MED ORDER — LIDOCAINE 2% (20 MG/ML) 5 ML SYRINGE
INTRAMUSCULAR | Status: AC
  Filled 2017-11-30: qty 5

## 2017-11-30 MED ORDER — OMEGA-3-ACID ETHYL ESTERS 1 G PO CAPS
1.0000 g | ORAL_CAPSULE | Freq: Three times a day (TID) | ORAL | Status: DC
  Filled 2017-11-30: qty 1

## 2017-11-30 MED ORDER — ESMOLOL HCL 100 MG/10ML IV SOLN
INTRAVENOUS | Status: DC | PRN
  Administered 2017-11-30: 30 mg via INTRAVENOUS

## 2017-11-30 MED ORDER — ORAL CARE MOUTH RINSE
15.0000 mL | Freq: Two times a day (BID) | OROMUCOSAL | Status: DC
  Administered 2017-12-01 – 2017-12-02 (×2): 15 mL via OROMUCOSAL

## 2017-11-30 MED ORDER — SUGAMMADEX SODIUM 200 MG/2ML IV SOLN
INTRAVENOUS | Status: AC
Start: 2017-11-30 — End: ?
  Filled 2017-11-30: qty 2

## 2017-11-30 MED ORDER — ARTIFICIAL TEARS OPHTHALMIC OINT
TOPICAL_OINTMENT | OPHTHALMIC | Status: AC
  Filled 2017-11-30: qty 3.5

## 2017-11-30 MED ORDER — PHENYLEPHRINE 40 MCG/ML (10ML) SYRINGE FOR IV PUSH (FOR BLOOD PRESSURE SUPPORT)
PREFILLED_SYRINGE | INTRAVENOUS | Status: DC | PRN
  Administered 2017-11-30: 40 ug via INTRAVENOUS
  Administered 2017-11-30: 80 ug via INTRAVENOUS
  Administered 2017-11-30: 40 ug via INTRAVENOUS
  Administered 2017-11-30: 80 ug via INTRAVENOUS
  Administered 2017-11-30: 40 ug via INTRAVENOUS

## 2017-11-30 MED ORDER — ROCURONIUM BROMIDE 50 MG/5ML IV SOLN
INTRAVENOUS | Status: AC
  Filled 2017-11-30: qty 2

## 2017-11-30 MED ORDER — ROCURONIUM BROMIDE 100 MG/10ML IV SOLN
INTRAVENOUS | Status: DC | PRN
  Administered 2017-11-30: 30 mg via INTRAVENOUS
  Administered 2017-11-30: 70 mg via INTRAVENOUS
  Administered 2017-11-30: 30 mg via INTRAVENOUS
  Administered 2017-11-30: 20 mg via INTRAVENOUS
  Administered 2017-11-30: 30 mg via INTRAVENOUS

## 2017-11-30 MED ORDER — HEMOSTATIC AGENTS (NO CHARGE) OPTIME
TOPICAL | Status: DC | PRN
  Administered 2017-11-30 (×2): 1 via TOPICAL

## 2017-11-30 MED ORDER — PROTAMINE SULFATE 10 MG/ML IV SOLN
INTRAVENOUS | Status: DC | PRN
  Administered 2017-11-30 (×6): 10 mg via INTRAVENOUS
  Administered 2017-11-30: 30 mg via INTRAVENOUS
  Administered 2017-11-30: 10 mg via INTRAVENOUS

## 2017-11-30 MED ORDER — PROMETHAZINE HCL 25 MG/ML IJ SOLN: 6.2500 mg | INTRAMUSCULAR | Status: DC | PRN

## 2017-11-30 MED ORDER — EPINEPHRINE PF 1 MG/10ML IJ SOSY
PREFILLED_SYRINGE | INTRAMUSCULAR | Status: AC
  Filled 2017-11-30: qty 10

## 2017-11-30 MED ORDER — EPHEDRINE SULFATE 50 MG/ML IJ SOLN
INTRAMUSCULAR | Status: AC
  Filled 2017-11-30: qty 1

## 2017-11-30 MED ORDER — HEPARIN SODIUM (PORCINE) 1000 UNIT/ML IJ SOLN
INTRAMUSCULAR | Status: DC | PRN
  Administered 2017-11-30: 5000 [IU] via INTRAVENOUS
  Administered 2017-11-30 (×2): 8000 [IU] via INTRAVENOUS

## 2017-11-30 MED ORDER — DEXAMETHASONE SODIUM PHOSPHATE 10 MG/ML IJ SOLN
INTRAMUSCULAR | Status: DC | PRN
  Administered 2017-11-30: 10 mg via INTRAVENOUS

## 2017-11-30 MED ORDER — FAMOTIDINE 20 MG PO TABS: 20.0000 mg | ORAL_TABLET | Freq: Every day | ORAL | Status: DC

## 2017-11-30 MED ORDER — CEFAZOLIN SODIUM-DEXTROSE 2-4 GM/100ML-% IV SOLN
2.0000 g | INTRAVENOUS | Status: AC
  Administered 2017-11-30 (×2): 2 g via INTRAVENOUS

## 2017-11-30 MED ORDER — FISH OIL 1000 MG PO CAPS: 1000.0000 mg | ORAL_CAPSULE | Freq: Three times a day (TID) | ORAL | Status: DC

## 2017-11-30 MED ORDER — PANTOPRAZOLE SODIUM 40 MG PO TBEC
40.0000 mg | DELAYED_RELEASE_TABLET | Freq: Every day | ORAL | Status: DC
Start: 2017-11-30 — End: 2017-12-01

## 2017-11-30 MED ORDER — SODIUM CHLORIDE 0.9 % IV SOLN: 500.0000 mL | Freq: Once | INTRAVENOUS | Status: DC | PRN

## 2017-11-30 MED ORDER — EPHEDRINE SULFATE 50 MG/ML IJ SOLN
INTRAMUSCULAR | Status: DC | PRN
  Administered 2017-11-30: 15 mg via INTRAVENOUS

## 2017-11-30 MED ORDER — ARTIFICIAL TEARS OPHTHALMIC OINT
TOPICAL_OINTMENT | OPHTHALMIC | Status: DC | PRN
  Administered 2017-11-30: 1 via OPHTHALMIC

## 2017-11-30 MED ORDER — SODIUM CHLORIDE 0.9 % IV SOLN: INTRAVENOUS | Status: DC

## 2017-11-30 MED ORDER — METOPROLOL TARTRATE 5 MG/5ML IV SOLN: 2.0000 mg | INTRAVENOUS | Status: DC | PRN

## 2017-11-30 MED ORDER — CEFAZOLIN SODIUM-DEXTROSE 2-4 GM/100ML-% IV SOLN
2.0000 g | Freq: Three times a day (TID) | INTRAVENOUS | Status: AC
  Administered 2017-11-30 – 2017-12-01 (×2): 2 g via INTRAVENOUS
  Filled 2017-11-30 (×2): qty 100

## 2017-11-30 MED ORDER — ACETAMINOPHEN 325 MG RE SUPP: 325.0000 mg | RECTAL | Status: DC | PRN

## 2017-11-30 MED ORDER — CEFAZOLIN SODIUM 1 G IJ SOLR
INTRAMUSCULAR | Status: AC
  Filled 2017-11-30: qty 20

## 2017-11-30 MED ORDER — PROPOFOL 10 MG/ML IV BOLUS
INTRAVENOUS | Status: AC
  Filled 2017-11-30: qty 20

## 2017-11-30 MED ORDER — FENTANYL CITRATE (PF) 250 MCG/5ML IJ SOLN
INTRAMUSCULAR | Status: AC
  Filled 2017-11-30: qty 5

## 2017-11-30 MED ORDER — FUROSEMIDE 10 MG/ML IJ SOLN
INTRAMUSCULAR | Status: DC | PRN
  Administered 2017-11-30: 10 mg via INTRAMUSCULAR

## 2017-11-30 MED ORDER — LABETALOL HCL 5 MG/ML IV SOLN: 10.0000 mg | INTRAVENOUS | Status: DC | PRN

## 2017-11-30 MED ORDER — PHENYLEPHRINE HCL 10 MG/ML IJ SOLN
INTRAVENOUS | Status: DC | PRN
  Administered 2017-11-30: 25 ug/min via INTRAVENOUS

## 2017-11-30 MED ORDER — ATORVASTATIN CALCIUM 20 MG PO TABS: 20.0000 mg | ORAL_TABLET | Freq: Every evening | ORAL | Status: DC

## 2017-11-30 MED ORDER — ESMOLOL HCL 100 MG/10ML IV SOLN
INTRAVENOUS | Status: AC
  Filled 2017-11-30: qty 10

## 2017-11-30 MED ORDER — SODIUM CHLORIDE 0.9 % IV SOLN
INTRAVENOUS | Status: DC
  Administered 2017-11-30: 18:00:00 via INTRAVENOUS

## 2017-11-30 MED ORDER — ACETAMINOPHEN 325 MG PO TABS: 325.0000 mg | ORAL_TABLET | ORAL | Status: DC | PRN

## 2017-11-30 MED ORDER — CHLORHEXIDINE GLUCONATE CLOTH 2 % EX PADS: 6.0000 | MEDICATED_PAD | Freq: Once | CUTANEOUS | Status: DC

## 2017-11-30 MED ORDER — 0.9 % SODIUM CHLORIDE (POUR BTL) OPTIME
TOPICAL | Status: DC | PRN
  Administered 2017-11-30: 4000 mL

## 2017-11-30 MED ORDER — SUGAMMADEX SODIUM 200 MG/2ML IV SOLN
INTRAVENOUS | Status: DC | PRN
  Administered 2017-11-30: 200 mg via INTRAVENOUS

## 2017-11-30 MED ORDER — BUSPIRONE HCL 15 MG PO TABS
15.0000 mg | ORAL_TABLET | Freq: Two times a day (BID) | ORAL | Status: DC
  Filled 2017-11-30 (×5): qty 1

## 2017-11-30 MED ORDER — SODIUM CHLORIDE 0.9 % IV SOLN: Freq: Once | INTRAVENOUS | Status: DC

## 2017-11-30 MED ORDER — LACTATED RINGERS IV SOLN
INTRAVENOUS | Status: DC | PRN
  Administered 2017-11-30: 08:00:00 via INTRAVENOUS

## 2017-11-30 MED ORDER — HYDRALAZINE HCL 20 MG/ML IJ SOLN: 5.0000 mg | INTRAMUSCULAR | Status: DC | PRN

## 2017-11-30 MED ORDER — HEPARIN SODIUM (PORCINE) 1000 UNIT/ML IJ SOLN
INTRAMUSCULAR | Status: AC
Start: 2017-11-30 — End: ?
  Filled 2017-11-30: qty 1

## 2017-11-30 MED ORDER — SODIUM CHLORIDE 0.9 % IV SOLN
INTRAVENOUS | Status: AC
  Filled 2017-11-30: qty 1.2

## 2017-11-30 MED ORDER — LACTATED RINGERS IV SOLN
INTRAVENOUS | Status: DC | PRN
  Administered 2017-11-30 (×2): via INTRAVENOUS

## 2017-11-30 MED ORDER — HYDROMORPHONE HCL 2 MG/ML IJ SOLN: 0.2500 mg | INTRAMUSCULAR | Status: DC | PRN

## 2017-11-30 MED ORDER — LACTATED RINGERS IV SOLN
INTRAVENOUS | Status: DC | PRN
  Administered 2017-11-30 (×3): via INTRAVENOUS

## 2017-11-30 MED ORDER — POTASSIUM CHLORIDE CRYS ER 20 MEQ PO TBCR: 20.0000 meq | EXTENDED_RELEASE_TABLET | Freq: Every day | ORAL | Status: DC | PRN

## 2017-11-30 MED ORDER — ROCURONIUM BROMIDE 50 MG/5ML IV SOLN
INTRAVENOUS | Status: AC
  Filled 2017-11-30: qty 1

## 2017-11-30 MED ORDER — PHENOL 1.4 % MT LIQD
1.0000 | OROMUCOSAL | Status: DC | PRN
  Administered 2017-12-01: 1 via OROMUCOSAL
  Filled 2017-11-30: qty 177

## 2017-11-30 MED ORDER — MANNITOL 25 % IV SOLN
INTRAVENOUS | Status: DC | PRN
  Administered 2017-11-30: 25 g via INTRAVENOUS

## 2017-11-30 MED ORDER — SODIUM BICARBONATE 8.4 % IV SOLN
INTRAVENOUS | Status: AC
Start: 2017-11-30 — End: ?
  Filled 2017-11-30: qty 50

## 2017-11-30 MED ORDER — ONDANSETRON HCL 4 MG/2ML IJ SOLN
INTRAMUSCULAR | Status: AC
  Filled 2017-11-30: qty 2

## 2017-11-30 MED ORDER — ROCURONIUM BROMIDE 50 MG/5ML IV SOLN
INTRAVENOUS | Status: AC
Start: 2017-11-30 — End: ?
  Filled 2017-11-30: qty 1

## 2017-11-30 MED ORDER — MAGNESIUM SULFATE 2 GM/50ML IV SOLN
2.0000 g | Freq: Every day | INTRAVENOUS | Status: AC | PRN
  Administered 2017-12-01: 2 g via INTRAVENOUS
  Filled 2017-11-30: qty 50

## 2017-11-30 MED ORDER — PHENYLEPHRINE 40 MCG/ML (10ML) SYRINGE FOR IV PUSH (FOR BLOOD PRESSURE SUPPORT)
PREFILLED_SYRINGE | INTRAVENOUS | Status: AC
Start: 2017-11-30 — End: ?
  Filled 2017-11-30: qty 10

## 2017-11-30 MED ORDER — ASPIRIN EC 81 MG PO TBEC: 81.0000 mg | DELAYED_RELEASE_TABLET | Freq: Every evening | ORAL | Status: DC

## 2017-11-30 MED ORDER — ALBUMIN HUMAN 5 % IV SOLN
INTRAVENOUS | Status: DC | PRN
  Administered 2017-11-30 (×4): via INTRAVENOUS

## 2017-11-30 MED ORDER — OXYCODONE-ACETAMINOPHEN 5-325 MG PO TABS
1.0000 | ORAL_TABLET | ORAL | Status: DC | PRN
  Filled 2017-11-30: qty 2

## 2017-11-30 MED ORDER — FENTANYL CITRATE (PF) 250 MCG/5ML IJ SOLN
INTRAMUSCULAR | Status: DC | PRN
  Administered 2017-11-30 (×13): 50 ug via INTRAVENOUS

## 2017-11-30 MED ORDER — ALUM & MAG HYDROXIDE-SIMETH 200-200-20 MG/5ML PO SUSP: 15.0000 mL | ORAL | Status: DC | PRN

## 2017-11-30 MED ORDER — HEPARIN SODIUM (PORCINE) 5000 UNIT/ML IJ SOLN
5000.0000 [IU] | Freq: Three times a day (TID) | INTRAMUSCULAR | Status: DC
  Administered 2017-12-01 – 2017-12-02 (×5): 5000 [IU] via SUBCUTANEOUS
  Filled 2017-11-30 (×5): qty 1

## 2017-11-30 MED ORDER — MIDAZOLAM HCL 2 MG/2ML IJ SOLN
INTRAMUSCULAR | Status: AC
  Filled 2017-11-30: qty 2

## 2017-11-30 MED ORDER — MORPHINE SULFATE (PF) 4 MG/ML IV SOLN
4.0000 mg | INTRAVENOUS | Status: DC | PRN
  Administered 2017-11-30: 2 mg via INTRAVENOUS
  Administered 2017-12-01 – 2017-12-02 (×4): 4 mg via INTRAVENOUS
  Filled 2017-11-30 (×5): qty 1

## 2017-11-30 MED ORDER — PROPOFOL 10 MG/ML IV BOLUS
INTRAVENOUS | Status: DC | PRN
  Administered 2017-11-30: 50 mg via INTRAVENOUS
  Administered 2017-11-30: 120 mg via INTRAVENOUS

## 2017-11-30 MED ORDER — EPINEPHRINE PF 1 MG/10ML IJ SOSY
PREFILLED_SYRINGE | INTRAMUSCULAR | Status: DC | PRN
  Administered 2017-11-30 (×2): 1 ug via INTRAVENOUS

## 2017-11-30 MED ORDER — LIDOCAINE 2% (20 MG/ML) 5 ML SYRINGE
INTRAMUSCULAR | Status: DC | PRN
  Administered 2017-11-30: 60 mg via INTRAVENOUS

## 2017-11-30 MED ORDER — GUAIFENESIN-DM 100-10 MG/5ML PO SYRP: 15.0000 mL | ORAL_SOLUTION | ORAL | Status: DC | PRN

## 2017-11-30 MED ORDER — DEXAMETHASONE SODIUM PHOSPHATE 10 MG/ML IJ SOLN
INTRAMUSCULAR | Status: AC
  Filled 2017-11-30: qty 1

## 2017-11-30 SURGICAL SUPPLY — 70 items
ATTRACTOMAT 16X20 MAGNETIC DRP (DRAPES) ×3 IMPLANT
BAG ISOLATION DRAPE 18X18 (DRAPES) ×4 IMPLANT
BLADE SURG 11 STRL SS (BLADE) ×3 IMPLANT
CANISTER SUCT 3000ML PPV (MISCELLANEOUS) ×3 IMPLANT
CANNULA VESSEL 3MM 2 BLNT TIP (CANNULA) ×6 IMPLANT
CLIP VESOCCLUDE MED 24/CT (CLIP) ×3 IMPLANT
CLIP VESOCCLUDE SM WIDE 24/CT (CLIP) ×3 IMPLANT
DERMABOND ADVANCED (GAUZE/BANDAGES/DRESSINGS) ×2
DERMABOND ADVANCED .7 DNX12 (GAUZE/BANDAGES/DRESSINGS) ×4 IMPLANT
DRAPE ISOLATION BAG 18X18 (DRAPES) ×2
DRSG COVADERM 4X14 (GAUZE/BANDAGES/DRESSINGS) ×3 IMPLANT
DRSG COVADERM 4X8 (GAUZE/BANDAGES/DRESSINGS) ×3 IMPLANT
ELECT BLADE 6.5 EXT (BLADE) ×3 IMPLANT
ELECT REM PT RETURN 9FT ADLT (ELECTROSURGICAL) ×3
ELECTRODE REM PT RTRN 9FT ADLT (ELECTROSURGICAL) ×2 IMPLANT
FELT TEFLON 1X6 (MISCELLANEOUS) ×3 IMPLANT
GAUZE SPONGE 4X4 16PLY XRAY LF (GAUZE/BANDAGES/DRESSINGS) ×3 IMPLANT
GLOVE BIO SURGEON STRL SZ7 (GLOVE) ×3 IMPLANT
GLOVE BIO SURGEON STRL SZ7.5 (GLOVE) ×12 IMPLANT
GLOVE BIOGEL PI IND STRL 6 (GLOVE) ×2 IMPLANT
GLOVE BIOGEL PI IND STRL 6.5 (GLOVE) ×2 IMPLANT
GLOVE BIOGEL PI IND STRL 7.0 (GLOVE) ×4 IMPLANT
GLOVE BIOGEL PI INDICATOR 6 (GLOVE) ×1
GLOVE BIOGEL PI INDICATOR 6.5 (GLOVE) ×1
GLOVE BIOGEL PI INDICATOR 7.0 (GLOVE) ×2
GLOVE ECLIPSE 6.5 STRL STRAW (GLOVE) ×6 IMPLANT
GOWN STRL REUS W/ TWL LRG LVL3 (GOWN DISPOSABLE) ×12 IMPLANT
GOWN STRL REUS W/TWL LRG LVL3 (GOWN DISPOSABLE) ×6
GRAFT HEMASHIELD 18X9MM (Vascular Products) ×3 IMPLANT
HEMOSTAT SPONGE AVITENE ULTRA (HEMOSTASIS) ×6 IMPLANT
INSERT FOGARTY 61MM (MISCELLANEOUS) ×6 IMPLANT
INSERT FOGARTY SM (MISCELLANEOUS) ×12 IMPLANT
KIT BASIN OR (CUSTOM PROCEDURE TRAY) ×3 IMPLANT
KIT TURNOVER KIT B (KITS) ×3 IMPLANT
LOOP VESSEL MAXI BLUE (MISCELLANEOUS) ×3 IMPLANT
LOOP VESSEL MINI RED (MISCELLANEOUS) ×6 IMPLANT
NS IRRIG 1000ML POUR BTL (IV SOLUTION) ×6 IMPLANT
PACK AORTA (CUSTOM PROCEDURE TRAY) ×3 IMPLANT
PAD ARMBOARD 7.5X6 YLW CONV (MISCELLANEOUS) ×6 IMPLANT
RETAINER VISCERA MED (MISCELLANEOUS) ×3 IMPLANT
SPONGE LAP 18X18 5 PK (GAUZE/BANDAGES/DRESSINGS) ×3 IMPLANT
SPONGE LAP 18X18 X RAY DECT (DISPOSABLE) ×3 IMPLANT
STAPLER VISISTAT 35W (STAPLE) ×6 IMPLANT
SUT PDS AB 1 TP1 54 (SUTURE) ×6 IMPLANT
SUT PROLENE 3 0 SH 48 (SUTURE) ×9 IMPLANT
SUT PROLENE 3 0 SH DA (SUTURE) ×6 IMPLANT
SUT PROLENE 3 0 SH1 36 (SUTURE) ×6 IMPLANT
SUT PROLENE 5 0 C 1 24 (SUTURE) ×27 IMPLANT
SUT PROLENE 5 0 C 1 36 (SUTURE) ×12 IMPLANT
SUT PROLENE 5 0 CC1 (SUTURE) ×3 IMPLANT
SUT PROLENE 6 0 C 1 30 (SUTURE) ×3 IMPLANT
SUT PROLENE 6 0 CC (SUTURE) ×6 IMPLANT
SUT SILK 2 0 (SUTURE) ×2
SUT SILK 2 0 TIES 17X18 (SUTURE) ×1
SUT SILK 2 0SH CR/8 30 (SUTURE) ×6 IMPLANT
SUT SILK 2-0 18XBRD TIE 12 (SUTURE) ×4 IMPLANT
SUT SILK 2-0 18XBRD TIE BLK (SUTURE) ×2 IMPLANT
SUT SILK 3 0 (SUTURE) ×1
SUT SILK 3 0 TIES 17X18 (SUTURE) ×1
SUT SILK 3-0 18XBRD TIE 12 (SUTURE) ×2 IMPLANT
SUT SILK 3-0 18XBRD TIE BLK (SUTURE) ×2 IMPLANT
SUT VIC AB 2-0 SH 27 (SUTURE) ×6
SUT VIC AB 2-0 SH 27XBRD (SUTURE) ×12 IMPLANT
SUT VIC AB 3-0 SH 27 (SUTURE) ×4
SUT VIC AB 3-0 SH 27X BRD (SUTURE) ×8 IMPLANT
SUT VICRYL 4-0 PS2 18IN ABS (SUTURE) IMPLANT
TAPE UMBILICAL COTTON 1/8X30 (MISCELLANEOUS) ×9 IMPLANT
TOWEL GREEN STERILE (TOWEL DISPOSABLE) ×3 IMPLANT
TRAY FOLEY MTR SLVR 16FR STAT (SET/KITS/TRAYS/PACK) ×3 IMPLANT
WATER STERILE IRR 1000ML POUR (IV SOLUTION) ×6 IMPLANT

## 2017-11-30 NOTE — Anesthesia Procedure Notes (Signed)
Arterial Line Insertion Start/EndMay 21, 2019 7:50 AM, 2017/12/13 8:04 AM Performed by: Nils Pyle, CRNA, CRNA  Patient location: Pre-op. Preanesthetic checklist: patient identified, IV checked, site marked, risks and benefits discussed, surgical consent, monitors and equipment checked, pre-op evaluation and anesthesia consent Lidocaine 1% used for infiltration and patient sedated Right, radial was placed Catheter size: 20 G Hand hygiene performed  and maximum sterile barriers used   Attempts: 1 Procedure performed without using ultrasound guided technique. Ultrasound Notes:anatomy identified, needle tip was noted to be adjacent to the nerve/plexus identified and no ultrasound evidence of intravascular and/or intraneural injection Following insertion, dressing applied and Biopatch. Post procedure assessment: normal  Patient tolerated the procedure well with no immediate complications.

## 2017-11-30 NOTE — Anesthesia Procedure Notes (Signed)
Procedure Name: Intubation Date/Time: 12/12/2017 8:36 AM Performed by: Harden Mo, CRNA Pre-anesthesia Checklist: Patient identified, Emergency Drugs available, Suction available and Patient being monitored Patient Re-evaluated:Patient Re-evaluated prior to induction Oxygen Delivery Method: Circle System Utilized Preoxygenation: Pre-oxygenation with 100% oxygen Induction Type: IV induction Ventilation: Mask ventilation without difficulty and Oral airway inserted - appropriate to patient size Laryngoscope Size: Mac and 4 Grade View: Grade I Tube type: Subglottic suction tube Tube size: 7.5 mm Number of attempts: 1 Airway Equipment and Method: Stylet and Oral airway Placement Confirmation: ETT inserted through vocal cords under direct vision,  positive ETCO2 and breath sounds checked- equal and bilateral Secured at: 22 cm Tube secured with: Tape Dental Injury: Teeth and Oropharynx as per pre-operative assessment  Comments: Intubation by Dicie Beam, SRNA

## 2017-11-30 NOTE — Anesthesia Procedure Notes (Signed)
Anesthesia Procedure Image    

## 2017-11-30 NOTE — Anesthesia Procedure Notes (Signed)
Central Venous Catheter Insertion Performed by: Raylan Troiani, MD, anesthesiologist Start/End05/20/2019 7:44 AM, 11/30/2017 8:00 AM Patient location: Pre-op. Preanesthetic checklist: patient identified, IV checked, site marked, risks and benefits discussed, surgical consent, monitors and equipment checked, pre-op evaluation, timeout performed and anesthesia consent Position: Trendelenburg Lidocaine 1% used for infiltration and patient sedated Hand hygiene performed , maximum sterile barriers used  and Seldinger technique used Catheter size: 8 Fr Total catheter length 16. Central line was placed.Double lumen Procedure performed using ultrasound guided technique. Ultrasound Notes:anatomy identified, needle tip was noted to be adjacent to the nerve/plexus identified, no ultrasound evidence of intravascular and/or intraneural injection and image(s) printed for medical record Attempts: 1 Following insertion, dressing applied, line sutured and Biopatch. Post procedure assessment: blood return through all ports  Patient tolerated the procedure well with no immediate complications.        

## 2017-11-30 NOTE — Interval H&P Note (Signed)
History and Physical Interval Note:  11/27/2017 7:27 AM  Donald Poole  has presented today for surgery, with the diagnosis of juxtarenal abdominal aortic aneursym  The various methods of treatment have been discussed with the patient and family. After consideration of risks, benefits and other options for treatment, the patient has consented to  Procedure(s): AORTOBIFEMORAL BYPASS GRAFT (N/A) as a surgical intervention .  The patient's history has been reviewed, patient examined, no change in status, stable for surgery.  I have reviewed the patient's chart and labs.  Questions were answered to the patient's satisfaction.     Fabienne Bruns

## 2017-11-30 NOTE — Transfer of Care (Signed)
Immediate Anesthesia Transfer of Care Note  Patient: Donald Poole  Procedure(s) Performed: AORTA-BIFEMORAL BYPASS GRAFT USING 18 X9 MM X 40 CM HEMASHIELD GRAFT (Bilateral Abdomen) REPAIR JUXTORENAL ABDOMINAL AORTIC ANEURYSM (Bilateral Abdomen) LEFT ENDARTERECTOMY FEMORAL USING PROFUNDA PLASTY (Left Groin)  Patient Location: PACU  Anesthesia Type:General  Level of Consciousness: awake and alert   Airway & Oxygen Therapy: Patient Spontanous Breathing and Patient connected to nasal cannula oxygen  Post-op Assessment: Report given to RN, Post -op Vital signs reviewed and stable and Patient moving all extremities X 4  Post vital signs: Reviewed and stable  Last Vitals:  Vitals Value Taken Time  BP    Temp    Pulse    Resp    SpO2      Last Pain:  Vitals:   12-22-17 0654  TempSrc:   PainSc: 0-No pain         Complications: No apparent anesthesia complications

## 2017-11-30 NOTE — Anesthesia Preprocedure Evaluation (Addendum)
Anesthesia Evaluation  Patient identified by MRN, date of birth, ID band Patient awake    Reviewed: Allergy & Precautions, NPO status , Patient's Chart, lab work & pertinent test results  Airway Mallampati: II  TM Distance: >3 FB Neck ROM: Full    Dental no notable dental hx. (+) Teeth Intact, Dental Advisory Given   Pulmonary Current Smoker,    Pulmonary exam normal breath sounds clear to auscultation       Cardiovascular + Peripheral Vascular Disease  Normal cardiovascular exam Rhythm:Regular Rate:Normal     Neuro/Psych negative neurological ROS  negative psych ROS   GI/Hepatic Neg liver ROS, GERD  ,  Endo/Other  negative endocrine ROS  Renal/GU negative Renal ROS  negative genitourinary   Musculoskeletal negative musculoskeletal ROS (+)   Abdominal   Peds negative pediatric ROS (+)  Hematology negative hematology ROS (+)   Anesthesia Other Findings   Reproductive/Obstetrics negative OB ROS                            Anesthesia Physical Anesthesia Plan  ASA: III  Anesthesia Plan: General   Post-op Pain Management:    Induction: Intravenous  PONV Risk Score and Plan: 2 and Ondansetron, Dexamethasone and Treatment may vary due to age or medical condition  Airway Management Planned: Oral ETT  Additional Equipment: Arterial line, CVP and Ultrasound Guidance Line Placement  Intra-op Plan:   Post-operative Plan: Possible Post-op intubation/ventilation  Informed Consent: I have reviewed the patients History and Physical, chart, labs and discussed the procedure including the risks, benefits and alternatives for the proposed anesthesia with the patient or authorized representative who has indicated his/her understanding and acceptance.   Dental advisory given  Plan Discussed with: CRNA and Surgeon  Anesthesia Plan Comments:         Anesthesia Quick Evaluation

## 2017-11-30 NOTE — Op Note (Addendum)
Procedure: Aortobifemoral bypass with left common femoral endarterectomy and profundoplasty for repair of juxtarenal abdominal aortic aneurysm  Preoperative diagnosis: Juxtarenal abdominal aortic aneurysm  Postoperative diagnosis: Same  Anesthesia: General  Assistant: Aggie Moats, PA-C  Operative findings: #1 severe iliac occlusive disease number 2.  18 x 9 mm Dacron graft  3.  Clamp above the right renal below the left renal  Operative details: After obtaining informed consent, the patient was taken the operating.  The patient was placed in supine position operating table.  After induction of general anesthesia and endotracheal intubation, the patient's entire torso was prepped from the xiphoid down to the toes.  A Foley catheter was placed.  Nasogastric tube was placed.  Next longitudinal incision was made in the left groin carried down through subcutaneous tissues down level the left common femoral artery.  This had no pulse within it.  It was dissected free circumferentially and Vesseloops placed around the common femoral circumflex iliac branches profunda femoris and superficial femoral arteries.  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  Several tributary veins crossing over the distal external iliac artery were ligated and divided between silk ties to start a retroperitoneal tunnel.  Attention was then turned to the abdomen.  A midline laparotomy incision was made extending from the xiphoid down to the pubis.  Incision was carried through the cutaneous tissues down level of the fascia.  Fascia and peritoneum were incised for the full length the incision.  There is no obvious intra-abdominal pathology.  The small bowel was reflected to the right.  The colon omentum was reflected superiorly.  Dissection was then carried down the level of the abdominal aorta and the retroperitoneum.  There was a 6-1/2 cm abdominal aortic aneurysm.  Dissection can was carried up to the  level of the left renal vein.  And preoperative planning 9 you this would be a juxtarenal aneurysm so the vein was clamped on the IVC side and on the renal side preserving the adrenal and gonadal veins.  The renal vein was then divided and oversewn proximally and distally with a running 5-0 Prolene suture.  I then proceeded to dissect out the aortic neck and it was found that there would be a suitable clamping spite just above the right renal artery and just below the left renal artery.  The right renal artery was dissected free circumferentially and a vessel loop placed around this.  The left renal artery was identified on its inferior surface.  Dissection was then carried down the abdominal aorta the inferior mesenteric artery was identified and a vessel loop placed around this.  I then carried the retroperitoneal incision down to the level of the aortic bifurcation.  The left and right common iliac arteries were slightly aneurysmal about 3 cm diameter.  I carried the dissection down to the right common iliac artery with the intent of potentially using the right external iliac artery for outflow.  However this again had severe calcific disease and I did not believe would be a good site for grafting.  The common iliac artery was dissected free circumferentially just above the bifurcation.  In similar fashion the left common iliac artery was dissected free circumferentially just above the iliac bifurcation.  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  Attention was then turned to the right groin.  Longitudinal incision was made in the right groin carried on through the subtendinous tissues down the level of the right common femoral artery.  This did have a pulse within it.  The profunda femoris and superficial femoral arteries were dissected free circumferentially Vesseloops placed around these.  Dissection was then carried at the level of the distal external iliac artery and circumflex iliac  branches were dissected free circumferentially and Vesseloops placed around these.  The peroneal tunnels were then created retrocolic retrograde ureteral bilaterally and an umbilical tape placed under these.  The patient was then given a weight appropriate dose of intravenous heparin.  This was re-bolused a few times during the course of the procedure.  Next the right renal artery was occluded with a vessel loop.  The right and left common iliac arteries were occluded with Kovar clamps.  The IMA was controlled with a vessel loop.  The aorta was then clamped above the right renal and below the left renal artery.  Cautery was used to enter the aneurysm.  All the thrombus contents were removed.  There were really no obvious lumbar spine backbleeding.  Dissection was carried up to the level of aortic neck and a neck was fashioned just below the level of the right renal artery.  An 18 x 9 mm Dacron graft was then brought up in the operative field a felt strip was placed around the aorta and the aorta was sewn end-to-end to the native vessel using a running 3-0 Prolene suture.  At completion anastomosis there was still an area of bleeding along the posterior wall and this was repaired with two 3-0 Prolene pledgeted sutures.  Clamp was then removed and there was good pulsatile flow into both limbs.  I then oversewed the right iliac bifurcation with multiple layers of running 3-0 Prolene suture.  The clamp was removed from the right iliac.  The right limb of the graft was then placed through the retroperitoneal tunnel down to the right groin.  A longitudinal opening was made in the right common femoral artery graft was cut the length and beveled and sewn endograft side of artery using a running 5-0 Prolene suture.  Just prior to completion anastomosis it was forebled backbled and thoroughly flushed the anastomosis was secured clamps released there is good Doppler flow into the right foot immediately primarily posterior  tibial Doppler.  Hemostasis was obtained in the right groin.  Attention was then turned to the left groin.  Again the iliac on the left side was inspected after unclamping and had been chronically occluded.  This was ligated just above the bifurcation with an umbilical tape.  The left common femoral artery was controlled with a Henley clamp and distal control of the superficial femoral and profunda femoris arteries controlled with Vesseloops.  Several side branches were controlled with small Vesseloops.  Longitudinal opening was made in the left common femoral artery there was a large amount of calcific plaque you are obstructing about 90% of lumen.  I performed a femoral endarterectomy after finding a suitable plane and carried it from the distal external iliac artery all the way down into the origin of the profunda and superficial femoral artery.  Profundoplasty was performed.  Graft was then cut to length and sewn endograft to side of artery using a running 5-0 Prolene suture.  Just prior to completion anastomosis it was forebled backbled and thoroughly flushed anastomosis was secured clamps released there was some bleeding from the native posterior wall of the profunda femoris.  This was repaired with two 6-0 Prolene sutures.  These were pledgeted because the artery was thin-walled after endarterectomy.  There  was a brief pressure drop restoring flow to each leg of about 20 mmHg.  Patient also had good Doppler flow the posterior tibial and dorsalis pedis areas of the left foot.  Groins were then packed to achieve hemostasis.  Left and right renal arteries had good flow by Doppler.  Hemostasis was obtained with the assistance of 100 mg of protamine and direct pressure.  Retroperitoneum was then closed with a running 2-0 Vicryl suture.  The aortic sac was also closed with a running 2-0 Vicryl suture.  The viscera were returned to normal position.  The colon and small bowel were pink and healthy in appearance.  The  fascia was reapproximated using a running #1 PDS suture.  The skin of the abdomen was closed with staples.  Each groin was then closed in multiple layers of running 203 0 and 4-0 Vicryl subcuticular stitch in the skin.  Dermabond was applied.  Patient tolerated procedure well and there were no complications.  The instrument sponge needle count was correct the end of the case.  The patient was extubated in the operating room taken to recovery room in stable condition.  Fabienne Bruns, MD [facility] Office: [phone] Pager: [phone]

## 2017-12-01 ENCOUNTER — Encounter (HOSPITAL_COMMUNITY): Payer: Self-pay | Admitting: Vascular Surgery

## 2017-12-01 LAB — BLOOD GAS, ARTERIAL
Acid-base deficit: 4 mmol/L — ABNORMAL HIGH (ref 0.0–2.0)
BICARBONATE: 20.8 mmol/L (ref 20.0–28.0)
O2 Content: 3 L/min
O2 Saturation: 93.6 %
PH ART: 7.34 — AB (ref 7.350–7.450)
Patient temperature: 98.6
pCO2 arterial: 39.6 mmHg (ref 32.0–48.0)
pO2, Arterial: 73.8 mmHg — ABNORMAL LOW (ref 83.0–108.0)

## 2017-12-01 LAB — CBC
HCT: 34.1 % — ABNORMAL LOW (ref 39.0–52.0)
HEMATOCRIT: 35.8 % — AB (ref 39.0–52.0)
HEMOGLOBIN: 11.9 g/dL — AB (ref 13.0–17.0)
Hemoglobin: 10.9 g/dL — ABNORMAL LOW (ref 13.0–17.0)
MCH: 33.1 pg (ref 26.0–34.0)
MCH: 32.3 pg (ref 26.0–34.0)
MCHC: 33.2 g/dL (ref 30.0–36.0)
MCHC: 32 g/dL (ref 30.0–36.0)
MCV: 99.4 fL (ref 78.0–100.0)
MCV: 101.2 fL — AB (ref 78.0–100.0)
PLATELETS: 143 10*3/uL — AB (ref 150–400)
Platelets: 134 10*3/uL — ABNORMAL LOW (ref 150–400)
RBC: 3.6 MIL/uL — AB (ref 4.22–5.81)
RBC: 3.37 MIL/uL — AB (ref 4.22–5.81)
RDW: 13.5 % (ref 11.5–15.5)
RDW: 13.9 % (ref 11.5–15.5)
WBC: 10.8 10*3/uL — ABNORMAL HIGH (ref 4.0–10.5)
WBC: 16.2 10*3/uL — AB (ref 4.0–10.5)

## 2017-12-01 LAB — POCT I-STAT 7, (LYTES, BLD GAS, ICA,H+H)
ACID-BASE DEFICIT: 2 mmol/L (ref 0.0–2.0)
Acid-Base Excess: 1 mmol/L (ref 0.0–2.0)
Acid-base deficit: 4 mmol/L — ABNORMAL HIGH (ref 0.0–2.0)
Acid-base deficit: 4 mmol/L — ABNORMAL HIGH (ref 0.0–2.0)
Bicarbonate: 28.1 mmol/L — ABNORMAL HIGH (ref 20.0–28.0)
Bicarbonate: 23.5 mmol/L (ref 20.0–28.0)
Bicarbonate: 23.4 mmol/L (ref 20.0–28.0)
Bicarbonate: 24.7 mmol/L (ref 20.0–28.0)
Calcium, Ion: 1.2 mmol/L (ref 1.15–1.40)
Calcium, Ion: 1.11 mmol/L — ABNORMAL LOW (ref 1.15–1.40)
Calcium, Ion: 1.08 mmol/L — ABNORMAL LOW (ref 1.15–1.40)
Calcium, Ion: 1.08 mmol/L — ABNORMAL LOW (ref 1.15–1.40)
HCT: 32 % — ABNORMAL LOW (ref 39.0–52.0)
HEMATOCRIT: 35 % — AB (ref 39.0–52.0)
HEMATOCRIT: 29 % — AB (ref 39.0–52.0)
HEMATOCRIT: 34 % — AB (ref 39.0–52.0)
HEMOGLOBIN: 11.9 g/dL — AB (ref 13.0–17.0)
HEMOGLOBIN: 10.9 g/dL — AB (ref 13.0–17.0)
HEMOGLOBIN: 11.6 g/dL — AB (ref 13.0–17.0)
Hemoglobin: 9.9 g/dL — ABNORMAL LOW (ref 13.0–17.0)
O2 SAT: 98 %
O2 SAT: 99 %
O2 Saturation: 99 %
O2 Saturation: 99 %
PCO2 ART: 51.9 mmHg — AB (ref 32.0–48.0)
PCO2 ART: 51.6 mmHg — AB (ref 32.0–48.0)
PH ART: 7.335 — AB (ref 7.350–7.450)
PH ART: 7.189 — AB (ref 7.350–7.450)
PO2 ART: 147 mmHg — AB (ref 83.0–108.0)
PO2 ART: 168 mmHg — AB (ref 83.0–108.0)
POTASSIUM: 4.3 mmol/L (ref 3.5–5.1)
POTASSIUM: 4.8 mmol/L (ref 3.5–5.1)
POTASSIUM: 4.9 mmol/L (ref 3.5–5.1)
Patient temperature: 36.8
Patient temperature: 36.8
Potassium: 4.7 mmol/L (ref 3.5–5.1)
SODIUM: 140 mmol/L (ref 135–145)
Sodium: 140 mmol/L (ref 135–145)
Sodium: 138 mmol/L (ref 135–145)
Sodium: 139 mmol/L (ref 135–145)
TCO2: 30 mmol/L (ref 22–32)
TCO2: 25 mmol/L (ref 22–32)
TCO2: 25 mmol/L (ref 22–32)
TCO2: 27 mmol/L (ref 22–32)
pCO2 arterial: 43.9 mmHg (ref 32.0–48.0)
pCO2 arterial: 64.5 mmHg — ABNORMAL HIGH (ref 32.0–48.0)
pH, Arterial: 7.336 — ABNORMAL LOW (ref 7.350–7.450)
pH, Arterial: 7.262 — ABNORMAL LOW (ref 7.350–7.450)
pO2, Arterial: 168 mmHg — ABNORMAL HIGH (ref 83.0–108.0)
pO2, Arterial: 120 mmHg — ABNORMAL HIGH (ref 83.0–108.0)

## 2017-12-01 LAB — AMYLASE: AMYLASE: 56 U/L (ref 28–100)

## 2017-12-01 LAB — COMPREHENSIVE METABOLIC PANEL
ALT: 17 U/L (ref 17–63)
ANION GAP: 9 (ref 5–15)
AST: 31 U/L (ref 15–41)
Albumin: 3 g/dL — ABNORMAL LOW (ref 3.5–5.0)
Alkaline Phosphatase: 53 U/L (ref 38–126)
BILIRUBIN TOTAL: 0.7 mg/dL (ref 0.3–1.2)
BUN: 23 mg/dL — ABNORMAL HIGH (ref 6–20)
CO2: 23 mmol/L (ref 22–32)
CREATININE: 2.37 mg/dL — AB (ref 0.61–1.24)
Calcium: 7.6 mg/dL — ABNORMAL LOW (ref 8.9–10.3)
Chloride: 107 mmol/L (ref 101–111)
GFR calc Af Amer: 31 mL/min — ABNORMAL LOW (ref >60)
GFR, EST NON AFRICAN AMERICAN: 27 mL/min — AB (ref >60)
Glucose, Bld: 155 mg/dL — ABNORMAL HIGH (ref 65–99)
Potassium: 4.9 mmol/L (ref 3.5–5.1)
SODIUM: 139 mmol/L (ref 135–145)
TOTAL PROTEIN: 4.6 g/dL — AB (ref 6.5–8.1)

## 2017-12-01 LAB — MAGNESIUM: MAGNESIUM: 1.5 mg/dL — AB (ref 1.7–2.4)

## 2017-12-01 LAB — CREATININE, SERUM
CREATININE: 3.45 mg/dL — AB (ref 0.61–1.24)
GFR, EST AFRICAN AMERICAN: 20 mL/min — AB (ref >60)
GFR, EST NON AFRICAN AMERICAN: 17 mL/min — AB (ref >60)

## 2017-12-01 LAB — POCT ACTIVATED CLOTTING TIME: Activated Clotting Time: 186 seconds

## 2017-12-01 LAB — VITAMIN B12: Lab: 578 pg/mL (ref 200–1100)

## 2017-12-01 LAB — TSH WITH FREE T4 REFLEX: Lab: 0.6 m[IU]/L (ref 0.40–4.50)

## 2017-12-01 LAB — ELECTROPHORESIS-SERUM PROTEIN
Lab: 0.3 g/dL (ref 0.2–0.5)
Lab: 0.9 g/dL (ref 0.8–1.7)
Lab: 6.4 g/dL (ref 6.1–8.1)

## 2017-12-01 LAB — COPPER: Lab: 77 ug/dL (ref 70–175)

## 2017-12-01 LAB — FOLATE, SERUM: Lab: 7.7 ng/mL (ref 3.8–4.8)

## 2017-12-01 MED ORDER — SODIUM CHLORIDE 0.9% FLUSH: 9.0000 mL | INTRAVENOUS | Status: DC | PRN

## 2017-12-01 MED ORDER — SODIUM CHLORIDE 0.9 % IV SOLN
Freq: Once | INTRAVENOUS | Status: AC
  Administered 2017-12-01: 08:00:00 via INTRAVENOUS

## 2017-12-01 MED ORDER — MORPHINE SULFATE 2 MG/ML IV SOLN
INTRAVENOUS | Status: DC
  Administered 2017-12-01: 2 mg via INTRAVENOUS
  Administered 2017-12-01: 17:00:00 via INTRAVENOUS
  Administered 2017-12-01: 18 mg via INTRAVENOUS
  Administered 2017-12-01: 10.5 mg via INTRAVENOUS
  Administered 2017-12-01: 3 mg via INTRAVENOUS
  Filled 2017-12-01 (×2): qty 30

## 2017-12-01 MED ORDER — DIPHENHYDRAMINE HCL 12.5 MG/5ML PO ELIX: 12.5000 mg | ORAL_SOLUTION | Freq: Four times a day (QID) | ORAL | Status: DC | PRN

## 2017-12-01 MED ORDER — NALOXONE HCL 0.4 MG/ML IJ SOLN: 0.4000 mg | INTRAMUSCULAR | Status: DC | PRN

## 2017-12-01 MED ORDER — DEXTROSE-NACL 5-0.45 % IV SOLN
INTRAVENOUS | Status: DC
  Administered 2017-12-01 (×4): via INTRAVENOUS
  Administered 2017-12-02: 125 mL/h via INTRAVENOUS
  Administered 2017-12-02 (×2): via INTRAVENOUS

## 2017-12-01 MED ORDER — DIPHENHYDRAMINE HCL 50 MG/ML IJ SOLN: 12.5000 mg | Freq: Four times a day (QID) | INTRAMUSCULAR | Status: DC | PRN

## 2017-12-01 MED ORDER — ONDANSETRON HCL 4 MG/2ML IJ SOLN: 4.0000 mg | Freq: Four times a day (QID) | INTRAMUSCULAR | Status: DC | PRN

## 2017-12-01 MED ORDER — FAMOTIDINE IN NACL 20-0.9 MG/50ML-% IV SOLN
20.0000 mg | INTRAVENOUS | Status: DC
  Administered 2017-12-01 – 2017-12-02 (×2): 20 mg via INTRAVENOUS
  Filled 2017-12-01 (×2): qty 50

## 2017-12-01 MED ORDER — SODIUM CHLORIDE 0.9 % IV BOLUS
1000.0000 mL | Freq: Once | INTRAVENOUS | Status: AC
  Administered 2017-12-01: 1000 mL via INTRAVENOUS

## 2017-12-01 MED ORDER — MORPHINE SULFATE 2 MG/ML IV SOLN
INTRAVENOUS | Status: DC
  Administered 2017-12-01 – 2017-12-02 (×2): 0 mg via INTRAVENOUS
  Administered 2017-12-02: 6 mg via INTRAVENOUS
  Administered 2017-12-02: 5 mg via INTRAVENOUS
  Filled 2017-12-01: qty 25

## 2017-12-01 NOTE — Progress Notes (Signed)
[facility]  Subjective  - lots of pain  Objective 109/70 93 98.2 F (36.8 C) (Oral) 17 91%  Intake/Output Summary (Last 24 hours) at 12/01/2017 0759 Last data filed at 12/01/2017 0600 Gross per 24 hour  Intake 7028.75 ml  Output 2690 ml  Net 4338.75 ml   Abdomen incision clean as well as groins Doppler signals bilateral feet Urine output around 40/hr overnight  Assessment/Planning: 1.  Elevation of creatinine probably mild ATN.  Fluid bolus now increase IV rate  2. Pain start PCA  3.  Out of bed ambulate  4. Ileus Keep NG until passing flatus  5.  Acute blood loss anemia trend for now.  Transfuse only if symptomatic or hemoglobin < 7.5  Donald Poole 12/01/2017 7:59 AM --  Laboratory Lab Results: Recent Labs    12-08-17 1640 12/01/17 0315  WBC 15.9* 10.8*  HGB 12.3* 11.9*  HCT 37.5* 35.8*  PLT 140* 134*   BMET Recent Labs    12/08/17 2231 12/01/17 0315  NA 138 139  K 4.7 4.9  CL 108 107  CO2 22 23  GLUCOSE 170* 155*  BUN 19 23*  CREATININE 2.03* 2.37*  CALCIUM 7.7* 7.6*    COAG Lab Results  Component Value Date   INR 1.28 08-Dec-2017   INR 0.97 11/29/2017   No results found for: PTT

## 2017-12-01 NOTE — Progress Notes (Signed)
OT Cancellation Note  Patient Details Name: NICHALAS COIN MRN: [id number] DOB: Dec 23, 1950   Cancelled Treatment:    Reason Eval/Treat Not Completed: Other (comment). Spoke with RN and pt is in too much pain right now, they will be starting a PCA this morning. Would be better to see pt later in day. Will re-attempt eval as time allows.  Evette Georges [phone] 12/01/2017, 8:33 AM

## 2017-12-01 NOTE — Evaluation (Signed)
Occupational Therapy Evaluation Patient Details Name: Donald Poole MRN: [id number] DOB: [birth year] Today's Date: 12/01/2017    History of Present Illness Donald Poole is a 67 yo male s/p Aortobifemoral bypass with left common femoral endarterectomy and profundoplasty for repair of juxtarenal abdominal aortic aneurysm profundoplasty for repair of juxtarenal abdominal aortic aneurysm   Clinical Impression   This 67 yo male admitted and underwent above presents to acute OT with decreased balance, increased pain, increases/decreases in vital signs with movement and decreased strength all affecting his safety and independence with basic ADLs with his PLOF of being totally independent. He will benefit from acute OT with follow up OT on CIR to get back to to PLOF.  HR 116 (at beginning)-145 (as he was pivoting to recliner)  90% on 5 liters at beginning of session, got as low as 83% once in recliner on 5 liters (RN in room with encouragement of inspirometer, purse lipped breathing, and deep coughing holding a pillow on abdomen, and RN turning pt up to 6 liters. 91% at end of session on 6 liters (RN still in room)  Pt supine 94/63 (79) Sitting       86/72 Post getting to recliner 76/56 (64) RN made aware of these    Follow Up Recommendations  CIR;Supervision/Assistance - 24 hour    Equipment Recommendations  Other (comment)(TBD)    Recommendations for Other Services Rehab consult     Precautions / Restrictions Precautions Precautions: Fall Precaution Comments: monitor HR, BP, and O2; ng tube Restrictions Weight Bearing Restrictions: No      Mobility Bed Mobility Overal bed mobility: Needs Assistance Bed Mobility: Rolling;Sidelying to Sit Rolling: Mod assist;+2 for physical assistance Sidelying to sit: Mod assist;+2 for physical assistance       General bed mobility comments: VCs for technique  Transfers Overall transfer level: Needs assistance Equipment used: Rolling walker (2  wheeled) Transfers: Sit to/from [employer] Sit to Stand: Min assist;+2 physical assistance Stand pivot transfers: Min assist;+2 physical assistance       General transfer comment: VC's for sequencing with RW    Balance Overall balance assessment: Needs assistance Sitting-balance support: Bilateral upper extremity supported Sitting balance-Leahy Scale: Poor Sitting balance - Comments: posterior lean Postural control: Posterior lean Standing balance support: Bilateral upper extremity supported Standing balance-Leahy Scale: Poor Standing balance comment: posterior lean                           ADL either performed or assessed with clinical judgement   ADL Overall ADL's : Needs assistance/impaired Eating/Feeding: NPO   Grooming: Moderate assistance;Sitting   Upper Body Bathing: Moderate assistance;Sitting   Lower Body Bathing: Maximal assistance Lower Body Bathing Details (indicate cue type and reason): min A +2 sit<>stand Upper Body Dressing : Moderate assistance;Sitting   Lower Body Dressing: Maximal assistance Lower Body Dressing Details (indicate cue type and reason): Min A +2 sit<>stand Toilet Transfer: Minimal assistance;+2 for physical assistance;+2 for safety/equipment;Ambulation;Stand-pivot Toilet Transfer Details (indicate cue type and reason): bed>recliner Toileting- Clothing Manipulation and Hygiene: Maximal assistance Toileting - Clothing Manipulation Details (indicate cue type and reason): min A +2 sit<>stand             Vision Patient Visual Report: No change from baseline              Pertinent Vitals/Pain Pain Assessment: 0-10 Pain Score: 4  Pain Location: abdomen Pain Descriptors / Indicators: Sore Pain Intervention(s): Limited activity within  patient's tolerance;Monitored during session;PCA encouraged;Repositioned     Hand Dominance Right   Extremity/Trunk Assessment Upper Extremity Assessment Upper Extremity  Assessment: Overall WFL for tasks assessed   Lower Extremity Assessment Lower Extremity Assessment: Defer to PT evaluation   Cervical / Trunk Assessment Cervical / Trunk Assessment: Normal   Communication Communication Communication: No difficulties   Cognition Arousal/Alertness: Lethargic;Suspect due to medications Behavior During Therapy: Flat affect Overall Cognitive Status: Impaired/Different from baseline Area of Impairment: Orientation;Following commands;Safety/judgement;Problem solving                 Orientation Level: (pt was oriented to hospital, that he had had surgery, and it was May--but not year. Responses to questions were delayed)     Following Commands: Follows one step commands with increased time Safety/Judgement: Decreased awareness of safety;Decreased awareness of deficits   Problem Solving: Difficulty sequencing;Requires verbal cues;Requires tactile cues;Slow processing                Home Living Family/patient expects to be discharged to:: Inpatient rehab Living Arrangements: Spouse/significant other(unable to A pt) Available Help at Discharge: Family;Available PRN/intermittently Type of Home: House Home Access: Stairs to enter [employer] of Steps: 5 Entrance Stairs-Rails: Right;Left;Can reach both Home Layout: One level     Bathroom Shower/Tub: Tub/shower unit;Curtain   Firefighter: Standard     Home Equipment: Environmental consultant - 2 wheels;Cane - single point;Shower seat;Bedside commode          Prior Functioning/Environment Level of Independence: Independent                 OT Problem List: Decreased strength;Decreased range of motion;Decreased activity tolerance;Impaired balance (sitting and/or standing);Pain;Cardiopulmonary status limiting activity;Decreased knowledge of use of DME or AE      OT Treatment/Interventions: Self-care/ADL training;Balance training;DME and/or AE instruction;Patient/family education;Energy  conservation    OT Goals(Current goals can be found in the care plan section) Acute Rehab OT Goals Patient Stated Goal: to be able to take care of myself, my wife can't really help me OT Goal Formulation: With patient Time For Goal Achievement: 12/15/17 Potential to Achieve Goals: Good  OT Frequency: Min 3X/week   Barriers to D/C: Decreased caregiver support          Co-evaluation PT/OT/SLP Co-Evaluation/Treatment: Yes Reason for Co-Treatment: Complexity of the patient's impairments (multi-system involvement);For patient/therapist safety;To address functional/ADL transfers          AM-PAC PT "6 Clicks" Daily Activity     Outcome Measure Help from another person eating meals?: Total(npo) Help from another person taking care of personal grooming?: A Lot Help from another person toileting, which includes using toliet, bedpan, or urinal?: A Lot Help from another person bathing (including washing, rinsing, drying)?: A Lot Help from another person to put on and taking off regular upper body clothing?: A Lot Help from another person to put on and taking off regular lower body clothing?: Total 6 Click Score: 10   End of Session Equipment Utilized During Treatment: Gait belt;Rolling walker;Oxygen(5 liters) Nurse Communication: Mobility status(needs Aline cord reconnected)  Activity Tolerance: (limited by increase in HR, decrease in BP (but not dizzy), and decrease in O2 sats) Patient left: in chair;with call bell/phone within reach;with chair alarm set;with family/visitor present  OT Visit Diagnosis: Unsteadiness on feet (R26.81);Pain;Muscle weakness (generalized) (M62.81) Pain - part of body: (abdomen)                Time: 1610-9604 OT Time Calculation (min): 44 min Charges:  OT General  Charges $OT Visit: 1 Visit OT Evaluation $OT Eval Moderate Complexity: 1 Mod OT Treatments $Therapeutic Activity: 8-22 mins Ignacia Palma, OTR/L [phone] 12/01/2017 '

## 2017-12-01 NOTE — Evaluation (Addendum)
Physical Therapy Evaluation Patient Details Name: Donald Poole MRN: [id number] DOB: 1951-03-09 Today's Date: 12/01/2017   History of Present Illness  Donald Poole is a 67 yo male s/p Aortobifemoral bypass with left common femoral endarterectomy and profundoplasty for repair of juxtarenal abdominal aortic aneurysm 12/04/2017.   Clinical Impression  Patient is s/p above surgery resulting in functional limitations due to the deficits listed below (see PT Problem List). PTA, pt independent with all mobility living with wife with stairs to enter home. Upon eval pt presents with adomenal pain and lethargy but willing to participate with therapy. Patient is min A x2 for bed mobility and tolerating standing and pivoting into bedside chair while becoming tachycardiac and desatting.  Ambulation deferred 2/2 vitals (see below).  Patient utilizing PCA x2 during session with increasing lethargy by end of session. Given patients baseline and current presentation, recommending CIR consult. Patient will benefit from skilled PT to increase their independence and safety with mobility to allow discharge to the venue listed below.    Vitals: At Rest (semi fowler): BP 94/63 MAP 79   SpO2 92% on 5L  HR 116 Seated EOB: BP 86/72  Standing: HR 145, SpO2 82% on 5L Seated after activity (transfer to chair): BP 76/56, MAP 64   SpO2 93% on 6L after minutes of pursed lip breathing and IS use.     Follow Up Recommendations CIR    Equipment Recommendations  (TBD)    Recommendations for Other Services       Precautions / Restrictions Precautions Precautions: Fall Precaution Comments: monitor HR, BP, and O2; ng tube Restrictions Weight Bearing Restrictions: No      Mobility  Bed Mobility Overal bed mobility: Needs Assistance Bed Mobility: Rolling;Sidelying to Sit Rolling: Mod assist;+2 for physical assistance Sidelying to sit: Mod assist;+2 for physical assistance       General bed mobility comments: VCs for  technique  Transfers Overall transfer level: Needs assistance Equipment used: Rolling walker (2 wheeled) Transfers: Sit to/from [employer] Sit to Stand: Min assist;+2 physical assistance Stand pivot transfers: Min assist;+2 physical assistance       General transfer comment: VC's for sequencing with RW  Ambulation/Gait             General Gait Details: defered 2/2 tachy, de sat, drop in BP  Stairs            Wheelchair Mobility    Modified Rankin (Stroke Patients Only)       Balance Overall balance assessment: Needs assistance Sitting-balance support: Bilateral upper extremity supported Sitting balance-Leahy Scale: Poor Sitting balance - Comments: posterior lean Postural control: Posterior lean Standing balance support: Bilateral upper extremity supported Standing balance-Leahy Scale: Poor Standing balance comment: posterior lean                             Pertinent Vitals/Pain Pain Assessment: Faces Pain Score: 4  Faces Pain Scale: Hurts even more Pain Location: abdomen Pain Descriptors / Indicators: Sore Pain Intervention(s): Limited activity within patient's tolerance;Monitored during session;Premedicated before session;Repositioned    Home Living Family/patient expects to be discharged to:: Inpatient rehab Living Arrangements: Spouse/significant other Available Help at Discharge: Family;Available PRN/intermittently Type of Home: House Home Access: Stairs to enter Entrance Stairs-Rails: Right;Left;Can reach both Entrance Stairs-Number of Steps: 5 Home Layout: One level Home Equipment: Walker - 2 wheels;Cane - single point;Shower seat;Bedside commode      Prior Function Level of Independence: Independent  Hand Dominance   Dominant Hand: Right    Extremity/Trunk Assessment   Upper Extremity Assessment Upper Extremity Assessment: Defer to OT evaluation    Lower Extremity Assessment Lower  Extremity Assessment: (BLE 4/5 )    Cervical / Trunk Assessment Cervical / Trunk Assessment: Normal  Communication   Communication: No difficulties  Cognition Arousal/Alertness: Lethargic;Suspect due to medications Behavior During Therapy: Flat affect Overall Cognitive Status: Impaired/Different from baseline Area of Impairment: Orientation;Following commands;Safety/judgement;Problem solving                 Orientation Level: Person;Place;Time;Situation     Following Commands: Follows one step commands with increased time Safety/Judgement: Decreased awareness of safety;Decreased awareness of deficits   Problem Solving: Difficulty sequencing;Requires verbal cues;Requires tactile cues;Slow processing        General Comments      Exercises General Exercises - Lower Extremity Ankle Circles/Pumps: 20 reps;AROM;Both Long Arc Quad: 10 reps;AROM;Both   Assessment/Plan    PT Assessment Patient needs continued PT services  PT Problem List Pain;Cardiopulmonary status limiting activity;Decreased strength;Decreased activity tolerance;Decreased balance;Decreased mobility       PT Treatment Interventions DME instruction;Gait training;Stair training;Functional mobility training;Therapeutic activities;Therapeutic exercise;Balance training    PT Goals (Current goals can be found in the Care Plan section)  Acute Rehab PT Goals Patient Stated Goal: to be able to take care of myself, my wife can't really help me PT Goal Formulation: With patient Time For Goal Achievement: 12/15/17 Potential to Achieve Goals: Good    Frequency Min 3X/week   Barriers to discharge        Co-evaluation PT/OT/SLP Co-Evaluation/Treatment: Yes Reason for Co-Treatment: For patient/therapist safety;To address functional/ADL transfers PT goals addressed during session: Mobility/safety with mobility;Balance;Proper use of DME;Strengthening/ROM OT goals addressed during session: ADL's and  self-care;Proper use of Adaptive equipment and DME;Strengthening/ROM       AM-PAC PT "6 Clicks" Daily Activity  Outcome Measure Difficulty turning over in bed (including adjusting bedclothes, sheets and blankets)?: Unable Difficulty moving from lying on back to sitting on the side of the bed? : Unable Difficulty sitting down on and standing up from a chair with arms (e.g., wheelchair, bedside commode, etc,.)?: Unable Help needed moving to and from a bed to chair (including a wheelchair)?: A Lot Help needed walking in hospital room?: A Lot Help needed climbing 3-5 steps with a railing? : Total 6 Click Score: 8    End of Session Equipment Utilized During Treatment: Gait belt Activity Tolerance: Patient limited by lethargy;Patient tolerated treatment well Patient left: in chair;with call bell/phone within reach;with nursing/sitter in room;with family/visitor present Nurse Communication: Mobility status PT Visit Diagnosis: Unsteadiness on feet (R26.81);Pain;Muscle weakness (generalized) (M62.81) Pain - part of body: (abdomen)    Time: 1610-9604 PT Time Calculation (min) (ACUTE ONLY): 38 min   Charges:   PT Evaluation $PT Eval Low Complexity: 1 Low     PT G Codes:        Etta Grandchild, PT, DPT Acute Rehab Services Pager: [phone]    Etta Grandchild 12/01/2017, 4:09 PM

## 2017-12-01 NOTE — Anesthesia Postprocedure Evaluation (Signed)
Anesthesia Post Note  Patient: Donald Poole  Procedure(s) Performed: [place] BYPASS GRAFT USING 18 X9 MM X 40 CM HEMASHIELD GRAFT (Bilateral Abdomen) REPAIR JUXTORENAL ABDOMINAL AORTIC ANEURYSM (Bilateral Abdomen) LEFT ENDARTERECTOMY FEMORAL USING PROFUNDA PLASTY (Left Groin)     Patient location during evaluation: PACU Anesthesia Type: General Level of consciousness: awake and alert Pain management: pain level controlled Vital Signs Assessment: post-procedure vital signs reviewed and stable Respiratory status: spontaneous breathing, nonlabored ventilation, respiratory function stable and patient connected to nasal cannula oxygen Cardiovascular status: blood pressure returned to baseline and stable Postop Assessment: no apparent nausea or vomiting Anesthetic complications: no    Last Vitals:  Vitals:   12/01/17 0700 12/01/17 0800  BP:  107/71  Pulse:    Resp:  (!) 22  Temp: 36.8 C   SpO2:  90%    Last Pain:  Vitals:   12/01/17 0800  TempSrc:   PainSc: 10-Worst pain ever                 Korin Setzler S

## 2017-12-01 NOTE — Progress Notes (Addendum)
   Daily Progress Note   Assessment/Planning:   POD #1 s/p ABF with L fem EA and profundaplasty for juxtarenal AAA   Pt going into oliguric renal failure, likely secondary to intraop hypotension and aortic cross clamp  Pt given another 1 L NS as CVP on 6.  BP improved from SBP 90s into 110s.  CVP currently 11  Reportedly pt too sedate.  Currently on full dose PCA.  Will change to reduced dose.  Maintain BP to avoid further hypotension  MIVF at 125 cc/hr  AM labs   Subjective  - 1 Day Post-Op   Sleeping, wakes briefly then dozes back off, per nursing this has been his baseline since coming back from surgery   Objective   BP 115/56  Vitals:   12/01/17 1622 12/01/17 1703 12/01/17 1800 12/01/17 1900  BP:   93/61 96/65  Pulse:      Resp: Temp:      TempSrc:      SpO2: 92% 91% 92% 94%  Weight:         Intake/Output Summary (Last 24 hours) at 12/01/2017 1918 Last data filed at 12/01/2017 1900 Gross per 24 hour  Intake 4250.92 ml  Output 700 ml  Net 3550.92 ml    PULM  B rales  CV  RR, tachycardiac  GI  Appropriately TTP, - G/R    Laboratory   CBC CBC Latest Ref Rng & Units 12/01/2017 12/01/2017 12-12-2017  WBC 4.0 - 10.5 K/uL 16.2(H) 10.8(H) 15.9(H)  Hemoglobin 13.0 - 17.0 g/dL 10.9(L) 11.9(L) 12.3(L)  Hematocrit 39.0 - 52.0 % 34.1(L) 35.8(L) 37.5(L)  Platelets 150 - 400 K/uL 143(L) 134(L) 140(L)    BMET    Component Value Date/Time   NA 139 12/01/2017 0315   K 4.9 12/01/2017 0315   CL 107 12/01/2017 0315   CO2 23 12/01/2017 0315   GLUCOSE 155 (H) 12/01/2017 0315   BUN 23 (H) 12/01/2017 0315   CREATININE 3.45 (H) 12/01/2017 1614   CALCIUM 7.6 (L) 12/01/2017 0315   GFRNONAA 17 (L) 12/01/2017 1614   GFRAA 20 (L) 12/01/2017 1614    Leonides Sake, MD, FACS [facility] Office: [phone] Pager: [phone]  12/01/2017, 7:18 PM

## 2017-12-01 NOTE — Progress Notes (Signed)
PT Cancellation Note  Patient Details Name: Donald Poole MRN: [id number] DOB: 11-20-1950   Cancelled Treatment:    Reason Eval/Treat Not Completed: Pain limiting ability to participate Spoke with RN and pt is in too much pain right now, they will be starting a PCA this morning. Would be better to see pt later in day. Will re-attempt eval as time allows.  Etta Grandchild, PT, DPT Acute Rehab Services Pager: [phone]     Etta Grandchild 12/01/2017, 8:35 AM

## 2017-12-01 NOTE — Progress Notes (Signed)
Inpatient Rehabilitation  Per PT, OT request, patient was screened by Reannah Totten for appropriateness for an Inpatient Acute Rehab consult.  At this time we are recommending an Inpatient Rehab consult.  Please order if you are agreeable.    Leilyn Frayre, M.A., CCC/SLP Admission Coordinator  [facility]  Cell [phone]  

## 2017-12-02 ENCOUNTER — Inpatient Hospital Stay (HOSPITAL_COMMUNITY): Payer: Medicare HMO

## 2017-12-02 DIAGNOSIS — J9601 Acute respiratory failure with hypoxia: Secondary | ICD-10-CM

## 2017-12-02 DIAGNOSIS — I469 Cardiac arrest, cause unspecified: Secondary | ICD-10-CM

## 2017-12-02 DIAGNOSIS — N179 Acute kidney failure, unspecified: Secondary | ICD-10-CM

## 2017-12-02 DIAGNOSIS — Z978 Presence of other specified devices: Secondary | ICD-10-CM

## 2017-12-02 LAB — CBC
HEMATOCRIT: 30.1 % — AB (ref 39.0–52.0)
HEMATOCRIT: 26.2 % — AB (ref 39.0–52.0)
HEMOGLOBIN: 9.8 g/dL — AB (ref 13.0–17.0)
Hemoglobin: 8.8 g/dL — ABNORMAL LOW (ref 13.0–17.0)
MCH: 32.6 pg (ref 26.0–34.0)
MCH: 33.5 pg (ref 26.0–34.0)
MCHC: 32.6 g/dL (ref 30.0–36.0)
MCHC: 33.6 g/dL (ref 30.0–36.0)
MCV: 100 fL (ref 78.0–100.0)
MCV: 99.6 fL (ref 78.0–100.0)
Platelets: 120 10*3/uL — ABNORMAL LOW (ref 150–400)
Platelets: 116 10*3/uL — ABNORMAL LOW (ref 150–400)
RBC: 3.01 MIL/uL — AB (ref 4.22–5.81)
RBC: 2.63 MIL/uL — ABNORMAL LOW (ref 4.22–5.81)
RDW: 13.9 % (ref 11.5–15.5)
RDW: 14.1 % (ref 11.5–15.5)
WBC: 14.8 10*3/uL — AB (ref 4.0–10.5)
WBC: 13.8 10*3/uL — ABNORMAL HIGH (ref 4.0–10.5)

## 2017-12-02 LAB — BASIC METABOLIC PANEL
ANION GAP: 7 (ref 5–15)
ANION GAP: 7 (ref 5–15)
BUN: 35 mg/dL — ABNORMAL HIGH (ref 6–20)
BUN: 35 mg/dL — ABNORMAL HIGH (ref 6–20)
CALCIUM: 7.4 mg/dL — AB (ref 8.9–10.3)
CALCIUM: 6.9 mg/dL — AB (ref 8.9–10.3)
CO2: 21 mmol/L — AB (ref 22–32)
CO2: 16 mmol/L — ABNORMAL LOW (ref 22–32)
CREATININE: 3.02 mg/dL — AB (ref 0.61–1.24)
Chloride: 109 mmol/L (ref 101–111)
Chloride: 115 mmol/L — ABNORMAL HIGH (ref 101–111)
Creatinine, Ser: 3.5 mg/dL — ABNORMAL HIGH (ref 0.61–1.24)
GFR calc Af Amer: 20 mL/min — ABNORMAL LOW (ref >60)
GFR calc Af Amer: 23 mL/min — ABNORMAL LOW (ref >60)
GFR calc non Af Amer: 17 mL/min — ABNORMAL LOW (ref >60)
GFR, EST NON AFRICAN AMERICAN: 20 mL/min — AB (ref >60)
GLUCOSE: 124 mg/dL — AB (ref 65–99)
Glucose, Bld: 141 mg/dL — ABNORMAL HIGH (ref 65–99)
POTASSIUM: 4.9 mmol/L (ref 3.5–5.1)
Potassium: 4.8 mmol/L (ref 3.5–5.1)
Sodium: 137 mmol/L (ref 135–145)
Sodium: 138 mmol/L (ref 135–145)

## 2017-12-02 LAB — POCT I-STAT 3, ART BLOOD GAS (G3+)
ACID-BASE DEFICIT: 10 mmol/L — AB (ref 0.0–2.0)
Acid-base deficit: 9 mmol/L — ABNORMAL HIGH (ref 0.0–2.0)
Acid-base deficit: 14 mmol/L — ABNORMAL HIGH (ref 0.0–2.0)
Acid-base deficit: 16 mmol/L — ABNORMAL HIGH (ref 0.0–2.0)
BICARBONATE: 15 mmol/L — AB (ref 20.0–28.0)
Bicarbonate: 15.1 mmol/L — ABNORMAL LOW (ref 20.0–28.0)
Bicarbonate: 14.3 mmol/L — ABNORMAL LOW (ref 20.0–28.0)
Bicarbonate: 13.8 mmol/L — ABNORMAL LOW (ref 20.0–28.0)
O2 SAT: 89 %
O2 Saturation: 89 %
O2 Saturation: 97 %
O2 Saturation: 95 %
PCO2 ART: 52.5 mmHg — AB (ref 32.0–48.0)
PH ART: 7.357 (ref 7.350–7.450)
PH ART: 7.368 (ref 7.350–7.450)
PH ART: 7.101 — AB (ref 7.350–7.450)
PO2 ART: 125 mmHg — AB (ref 83.0–108.0)
Patient temperature: 98.5
Patient temperature: 98
TCO2: 16 mmol/L — ABNORMAL LOW (ref 22–32)
TCO2: 15 mmol/L — ABNORMAL LOW (ref 22–32)
TCO2: 16 mmol/L — AB (ref 22–32)
TCO2: 15 mmol/L — AB (ref 22–32)
pCO2 arterial: 26.9 mmHg — ABNORMAL LOW (ref 32.0–48.0)
pCO2 arterial: 24.8 mmHg — ABNORMAL LOW (ref 32.0–48.0)
pCO2 arterial: 48.4 mmHg — ABNORMAL HIGH (ref 32.0–48.0)
pH, Arterial: 7.027 — CL (ref 7.350–7.450)
pO2, Arterial: 58 mmHg — ABNORMAL LOW (ref 83.0–108.0)
pO2, Arterial: 56 mmHg — ABNORMAL LOW (ref 83.0–108.0)
pO2, Arterial: 107 mmHg (ref 83.0–108.0)

## 2017-12-02 LAB — RENAL FUNCTION PANEL
Albumin: 2 g/dL — ABNORMAL LOW (ref 3.5–5.0)
Anion gap: 14 (ref 5–15)
BUN: 35 mg/dL — AB (ref 6–20)
CHLORIDE: 113 mmol/L — AB (ref 101–111)
CO2: 16 mmol/L — ABNORMAL LOW (ref 22–32)
Calcium: 6.5 mg/dL — ABNORMAL LOW (ref 8.9–10.3)
Creatinine, Ser: 3.49 mg/dL — ABNORMAL HIGH (ref 0.61–1.24)
GFR, EST AFRICAN AMERICAN: 20 mL/min — AB (ref >60)
GFR, EST NON AFRICAN AMERICAN: 17 mL/min — AB (ref >60)
Glucose, Bld: 116 mg/dL — ABNORMAL HIGH (ref 65–99)
POTASSIUM: 6.1 mmol/L — AB (ref 3.5–5.1)
Phosphorus: 9.1 mg/dL — ABNORMAL HIGH (ref 2.5–4.6)
Sodium: 143 mmol/L (ref 135–145)

## 2017-12-02 LAB — POCT I-STAT, CHEM 8
BUN: 47 mg/dL — AB (ref 6–20)
CHLORIDE: 109 mmol/L (ref 101–111)
Calcium, Ion: 0.86 mmol/L — CL (ref 1.15–1.40)
Creatinine, Ser: 3.1 mg/dL — ABNORMAL HIGH (ref 0.61–1.24)
GLUCOSE: 108 mg/dL — AB (ref 65–99)
HCT: 25 % — ABNORMAL LOW (ref 39.0–52.0)
Hemoglobin: 8.5 g/dL — ABNORMAL LOW (ref 13.0–17.0)
Potassium: 6.1 mmol/L — ABNORMAL HIGH (ref 3.5–5.1)
SODIUM: 141 mmol/L (ref 135–145)
TCO2: 20 mmol/L — AB (ref 22–32)

## 2017-12-02 LAB — POCT I-STAT 4, (NA,K, GLUC, HGB,HCT)
Glucose, Bld: 105 mg/dL — ABNORMAL HIGH (ref 65–99)
HEMATOCRIT: 25 % — AB (ref 39.0–52.0)
HEMOGLOBIN: 8.5 g/dL — AB (ref 13.0–17.0)
POTASSIUM: 6.1 mmol/L — AB (ref 3.5–5.1)
SODIUM: 142 mmol/L (ref 135–145)

## 2017-12-02 LAB — GLUCOSE, CAPILLARY: GLUCOSE-CAPILLARY: 86 mg/dL (ref 65–99)

## 2017-12-02 LAB — TROPONIN I: Troponin I: 34.84 ng/mL (ref <0.03)

## 2017-12-02 LAB — LACTIC ACID, PLASMA: Lactic Acid, Venous: 8.7 mmol/L (ref 0.5–1.9)

## 2017-12-02 MED ORDER — PRISMASOL BGK 4/2.5 32-4-2.5 MEQ/L IV SOLN
INTRAVENOUS | Status: DC
  Administered 2017-12-02: 17:00:00 via INTRAVENOUS_CENTRAL
  Filled 2017-12-02 (×7): qty 5000

## 2017-12-02 MED ORDER — DEXTROSE 5 % IV SOLN
0.0000 ug/min | INTRAVENOUS | Status: DC
  Filled 2017-12-02: qty 4

## 2017-12-02 MED ORDER — SODIUM BICARBONATE 8.4 % IV SOLN
INTRAVENOUS | Status: AC
  Filled 2017-12-02: qty 100

## 2017-12-02 MED ORDER — FENTANYL CITRATE (PF) 100 MCG/2ML IJ SOLN: 50.0000 ug | INTRAMUSCULAR | Status: DC | PRN

## 2017-12-02 MED ORDER — HEPARIN SODIUM (PORCINE) 1000 UNIT/ML DIALYSIS
1000.0000 [IU] | INTRAMUSCULAR | Status: DC | PRN
  Administered 2017-12-02: 4000 [IU] via INTRAVENOUS_CENTRAL
  Administered 2017-12-02: 1000 [IU] via INTRAVENOUS_CENTRAL
  Filled 2017-12-02 (×2): qty 4

## 2017-12-02 MED ORDER — SODIUM CHLORIDE 0.9% FLUSH: 10.0000 mL | Freq: Two times a day (BID) | INTRAVENOUS | Status: DC

## 2017-12-02 MED ORDER — NICOTINE 14 MG/24HR TD PT24
14.0000 mg | MEDICATED_PATCH | Freq: Every day | TRANSDERMAL | Status: DC
  Administered 2017-12-02: 14 mg via TRANSDERMAL
  Filled 2017-12-02: qty 1

## 2017-12-02 MED ORDER — DEXTROSE 5 % IV SOLN
0.5000 ug/min | INTRAVENOUS | Status: DC
  Administered 2017-12-02: 1 ug/min via INTRAVENOUS
  Filled 2017-12-02: qty 4

## 2017-12-02 MED ORDER — LIDOCAINE HCL (PF) 1 % IJ SOLN
INTRAMUSCULAR | Status: AC
  Filled 2017-12-02: qty 5

## 2017-12-02 MED ORDER — MIDAZOLAM HCL 2 MG/2ML IJ SOLN
INTRAMUSCULAR | Status: AC
  Administered 2017-12-02: 2 mg
  Filled 2017-12-02: qty 4

## 2017-12-02 MED ORDER — EPINEPHRINE PF 1 MG/10ML IJ SOSY
PREFILLED_SYRINGE | INTRAMUSCULAR | Status: DC
Start: 2017-12-02 — End: 2017-12-03
  Filled 2017-12-02: qty 10

## 2017-12-02 MED ORDER — VASOPRESSIN 20 UNIT/ML IV SOLN
0.0300 [IU]/min | INTRAVENOUS | Status: DC
  Administered 2017-12-02: 0.03 [IU]/min via INTRAVENOUS
  Filled 2017-12-02: qty 2

## 2017-12-02 MED ORDER — ROCURONIUM BROMIDE 50 MG/5ML IV SOLN
70.0000 mg | Freq: Once | INTRAVENOUS | Status: AC
  Administered 2017-12-02: 70 mg via INTRAVENOUS
  Filled 2017-12-02: qty 7

## 2017-12-02 MED ORDER — SODIUM CHLORIDE 0.9 % IV SOLN
Freq: Once | INTRAVENOUS | Status: AC
  Administered 2017-12-02: 1000 mL via INTRAVENOUS

## 2017-12-02 MED ORDER — DEXMEDETOMIDINE HCL IN NACL 200 MCG/50ML IV SOLN
0.0000 ug/kg/h | INTRAVENOUS | Status: DC
  Administered 2017-12-02: 0.5 ug/kg/h via INTRAVENOUS
  Filled 2017-12-02: qty 50

## 2017-12-02 MED ORDER — PRISMASOL BGK 4/2.5 32-4-2.5 MEQ/L IV SOLN
INTRAVENOUS | Status: DC
  Administered 2017-12-02: 17:00:00 via INTRAVENOUS_CENTRAL
  Filled 2017-12-02 (×5): qty 5000

## 2017-12-02 MED ORDER — SODIUM CHLORIDE 0.9 % IV SOLN
2.0000 g | Freq: Once | INTRAVENOUS | Status: AC
  Administered 2017-12-02: 2 g via INTRAVENOUS
  Filled 2017-12-02: qty 2

## 2017-12-02 MED ORDER — FENTANYL BOLUS VIA INFUSION
25.0000 ug | INTRAVENOUS | Status: DC | PRN
  Filled 2017-12-02: qty 25

## 2017-12-02 MED ORDER — SODIUM CHLORIDE 0.9 % IV SOLN
0.0000 ug/min | INTRAVENOUS | Status: DC
  Filled 2017-12-02: qty 1

## 2017-12-02 MED ORDER — ALBUMIN HUMAN 5 % IV SOLN
12.5000 g | Freq: Once | INTRAVENOUS | Status: AC
  Administered 2017-12-02: 12.5 g via INTRAVENOUS

## 2017-12-02 MED ORDER — VANCOMYCIN HCL 10 G IV SOLR
1500.0000 mg | Freq: Once | INTRAVENOUS | Status: DC
  Filled 2017-12-02: qty 1500

## 2017-12-02 MED ORDER — IPRATROPIUM-ALBUTEROL 0.5-2.5 (3) MG/3ML IN SOLN: 3.0000 mL | RESPIRATORY_TRACT | Status: DC | PRN

## 2017-12-02 MED ORDER — MIDAZOLAM HCL 2 MG/2ML IJ SOLN: 1.0000 mg | INTRAMUSCULAR | Status: DC | PRN

## 2017-12-02 MED ORDER — SODIUM CHLORIDE 0.9 % IV SOLN
0.0000 ug/min | INTRAVENOUS | Status: DC
  Filled 2017-12-02: qty 2

## 2017-12-02 MED ORDER — SODIUM CHLORIDE 0.9% FLUSH: 10.0000 mL | INTRAVENOUS | Status: DC | PRN

## 2017-12-02 MED ORDER — ORAL CARE MOUTH RINSE
15.0000 mL | Freq: Four times a day (QID) | OROMUCOSAL | Status: DC
  Administered 2017-12-02: 15 mL via OROMUCOSAL

## 2017-12-02 MED ORDER — ETOMIDATE 2 MG/ML IV SOLN
20.0000 mg | Freq: Once | INTRAVENOUS | Status: AC
  Administered 2017-12-02: 20 mg via INTRAVENOUS

## 2017-12-02 MED ORDER — SODIUM CHLORIDE 0.9 % IV SOLN: Freq: Once | INTRAVENOUS | Status: DC

## 2017-12-02 MED ORDER — SODIUM CHLORIDE 0.9 % IV SOLN
0.0000 ug/min | INTRAVENOUS | Status: DC
  Filled 2017-12-02 (×2): qty 4

## 2017-12-02 MED ORDER — PRISMASOL BGK 4/2.5 32-4-2.5 MEQ/L IV SOLN
INTRAVENOUS | Status: DC
  Administered 2017-12-02: 17:00:00 via INTRAVENOUS_CENTRAL
  Filled 2017-12-02 (×6): qty 5000

## 2017-12-02 MED ORDER — SODIUM BICARBONATE 8.4 % IV SOLN
INTRAVENOUS | Status: AC
  Filled 2017-12-02: qty 150

## 2017-12-02 MED ORDER — SODIUM CHLORIDE 0.9 % IV SOLN
2.0000 g | Freq: Two times a day (BID) | INTRAVENOUS | Status: DC
  Filled 2017-12-02: qty 2

## 2017-12-02 MED ORDER — FUROSEMIDE 10 MG/ML IJ SOLN
40.0000 mg | Freq: Once | INTRAMUSCULAR | Status: AC
  Administered 2017-12-02: 40 mg via INTRAVENOUS
  Filled 2017-12-02: qty 4

## 2017-12-02 MED ORDER — CHLORHEXIDINE GLUCONATE 0.12% ORAL RINSE (MEDLINE KIT): 15.0000 mL | Freq: Two times a day (BID) | OROMUCOSAL | Status: DC

## 2017-12-02 MED ORDER — NOREPINEPHRINE BITARTRATE 1 MG/ML IV SOLN
0.0000 ug/min | INTRAVENOUS | Status: DC
  Administered 2017-12-02: 10 ug/min via INTRAVENOUS
  Filled 2017-12-02 (×2): qty 16

## 2017-12-02 MED ORDER — ALBUMIN HUMAN 5 % IV SOLN
INTRAVENOUS | Status: AC
  Filled 2017-12-02: qty 250

## 2017-12-02 MED ORDER — EPINEPHRINE PF 1 MG/ML IJ SOLN
0.5000 ug/min | INTRAVENOUS | Status: DC
  Filled 2017-12-02 (×2): qty 4

## 2017-12-02 MED ORDER — VANCOMYCIN HCL IN DEXTROSE 1-5 GM/200ML-% IV SOLN: 1000.0000 mg | INTRAVENOUS | Status: DC

## 2017-12-02 MED ORDER — SODIUM CHLORIDE 0.9 % FOR CRRT
INTRAVENOUS_CENTRAL | Status: DC | PRN
  Filled 2017-12-02: qty 1000

## 2017-12-02 MED ORDER — SODIUM BICARBONATE 8.4 % IV SOLN
INTRAVENOUS | Status: DC
  Administered 2017-12-02: 18:00:00 via INTRAVENOUS
  Filled 2017-12-02 (×2): qty 850

## 2017-12-02 MED ORDER — SODIUM BICARBONATE 8.4 % IV SOLN
INTRAVENOUS | Status: AC
  Administered 2017-12-02: 50 meq
  Filled 2017-12-02: qty 50

## 2017-12-02 MED ORDER — FENTANYL CITRATE (PF) 100 MCG/2ML IJ SOLN
INTRAMUSCULAR | Status: AC
  Administered 2017-12-02: 50 ug
  Filled 2017-12-02: qty 2

## 2017-12-02 MED ORDER — FENTANYL 2500MCG IN NS 250ML (10MCG/ML) PREMIX INFUSION
25.0000 ug/h | INTRAVENOUS | Status: DC
  Administered 2017-12-02: 25 ug/h via INTRAVENOUS
  Filled 2017-12-02: qty 250

## 2017-12-02 MED FILL — Heparin Sodium (Porcine) Inj 1000 Unit/ML: INTRAMUSCULAR | Qty: 10 | Status: AC

## 2017-12-02 MED FILL — Sodium Chloride IV Soln 0.9%: INTRAVENOUS | Qty: 1000 | Status: AC

## 2017-12-03 MED FILL — Medication: Qty: 1 | Status: AC

## 2017-12-05 ENCOUNTER — Ambulatory Visit: Admit: 2017-12-05 | Discharge: 2017-12-05 | Payer: MEDICARE

## 2017-12-05 DIAGNOSIS — G9389 Other specified disorders of brain: Principal | ICD-10-CM

## 2017-12-06 LAB — BPAM RBC
BLOOD PRODUCT EXPIRATION DATE: 201905282359
BLOOD PRODUCT EXPIRATION DATE: 201906012359
Blood Product Expiration Date: 201905282359
Blood Product Expiration Date: 201906012359
Blood Product Expiration Date: 201906012359
Blood Product Expiration Date: 201906012359
Blood Product Expiration Date: 201906012359
Blood Product Expiration Date: 201906022359
ISSUE DATE / TIME: 201905050007
ISSUE DATE / TIME: 201905050112
ISSUE DATE / TIME: 201905101314
ISSUE DATE / TIME: 201905101314
ISSUE DATE / TIME: 201905131737
ISSUE DATE / TIME: 201905080823
ISSUE DATE / TIME: 201905101314
ISSUE DATE / TIME: 201905101314
UNIT TYPE AND RH: 5100
UNIT TYPE AND RH: 5100
UNIT TYPE AND RH: 5100
UNIT TYPE AND RH: 5100
UNIT TYPE AND RH: 5100
Unit Type and Rh: 5100
Unit Type and Rh: 5100
Unit Type and Rh: 5100

## 2017-12-06 LAB — TYPE AND SCREEN
ABO/RH(D): O POS
Antibody Screen: NEGATIVE
UNIT DIVISION: 0
UNIT DIVISION: 0
UNIT DIVISION: 0
UNIT DIVISION: 0
Unit division: 0
Unit division: 0
Unit division: 0
Unit division: 0

## 2017-12-24 NOTE — Progress Notes (Signed)
Dr. Craige Cotta at bedside along with Dr. Hyman Hopes. PT intubated and HD cath placed.

## 2017-12-24 NOTE — Code Documentation (Signed)
CODE BLUE NOTE  Patient Name: Donald Poole   MRN: [id number]   Date of Birth/ Sex: October 18, 1950 , male      Admission Date: 12/13/2017  Attending Provider: Sherren Kerns, MD  Primary Diagnosis: juxtarenal abdominal aortic aneursym    Indication: Found to be bradycardic and descending about 1 hour after prior code. Code blue was subsequently called. At the time of arrival on scene, ACLS protocol was underway.   Technical Description:  - CPR performance duration:  15 minutes+  - Was defibrillation or cardioversion used? No   - Was external pacer placed? No  - Was patient intubated pre/post CPR? PRe    Medications Administered: Y = Yes; Blank = No Amiodarone    Atropine    Calcium    Epinephrine  Y( IV push and already on gtt)  Lidocaine    Magnesium    Norepinephrine  (gtt already on)  Phenylephrine  (gtt on )  Sodium bicarbonate    Vasopressin  (gtt already on)  Other D50. insulin    Post CPR evaluation:  - Final Status - Was patient successfully resuscitated ? No   Miscellaneous Information:  - Time of death:  7:38 PM  - Primary team notified?  Yes  - Family Notified? Yes        Garnette Gunner, MD   2017/12/10, 7:35 PM

## 2017-12-24 NOTE — Death Summary Note (Signed)
     Physician Discharge Summary  Patient ID: Donald Poole MRN: [id number] DOB/AGE: [birth year] 67 y.o.  Admit date: 12/23/2017 Discharge date: Dec 03, 2017  Admission Diagnosis: Juxtarenal abdominal aortic aneurysm  Discharge Diagnoses:  Juxtarenal abdominal aortic aneurysm Acute renal failure Pulmonary insufficiency  Secondary Diagnoses: Active Problems:   AAA (abdominal aortic aneurysm) without rupture (HCC)   Acute renal failure (HCC)   Endotracheally intubated   Procedures: Aortobifemoral bypass with left common femoral endarterectomy and profundoplasty for repair of juxtarenal abdominal aortic aneurysm    Discharged Condition: Deceased  Hospital Course: Patient was admitted following the above-noted procedure.  In the postoperative period he had progressive renal failure for which dialysis was started.  He was also intubated.  On the night of postoperative day #2 he had a code event which with ROSC in 16 minutes following ACLS.  He then had a second code event and time of death was called at 1950  I was notified of the patient's death of my arrival the family was gone patient and nurse were in the room.  I called the patient's wife to offer my condolences.  Consults:  Treatment Team:  Elvis Coil, MD   Critical care, Dr. Craige Cotta  Significant Diagnostic Studies: CBC CBC Latest Ref Rng & Units 12/03/17 12/03/2017 12-03-2017  WBC 4.0 - 10.5 K/uL - 13.8(H) 14.8(H)  Hemoglobin 13.0 - 17.0 g/dL 7.8(G) 9.5(A) 2.1(H)  Hematocrit 39.0 - 52.0 % 25.0(L) 26.2(L) 30.1(L)  Platelets 150 - 400 K/uL - 116(L) 120(L)      COAG Lab Results  Component Value Date   INR 1.28 12/07/2017   INR 0.97 11/29/2017   No results found for: PTT     Signed: Namiyah Grantham C. Randie Heinz, MD [facility] Office: [phone] Pager: [phone]   03-Dec-2017, 9:00 PM

## 2017-12-24 NOTE — Procedures (Signed)
Central Venous Catheter Insertion Procedure Note Donald Poole [id number] 10-25-1950  Procedure: Insertion of hemodialysis catheter. Indications: Acute renal failure  Procedure Details Consent: Risks of procedure as well as the alternatives and risks of each were explained to the (patient/caregiver).  Consent for procedure obtained. Time Out: Verified patient identification, verified procedure, site/side was marked, verified correct patient position, special equipment/implants available, medications/allergies/relevent history reviewed, required imaging and test results available.  Performed  Maximum sterile technique was used including antiseptics, cap, gloves, gown, hand hygiene, mask and sheet. Skin prep: Chlorhexidine; local anesthetic administered A antimicrobial bonded/coated triple lumen hemodialysis catheter was placed in the left internal jugular vein using the Seldinger technique.  Evaluation Blood flow good Complications: No apparent complications Patient did tolerate procedure well. Chest X-ray ordered to verify placement.  CXR: pending.  Coralyn Helling, MD [facility] Pulmonary/Critical Care 12/10/2017, 3:39 PM

## 2017-12-24 NOTE — Consult Note (Signed)
Referring Provider: No ref. provider found Primary Care Physician:  Toma Deiters, MD Primary Nephrologist:    Reason for Consultation:     HPI:  50 yer old man who had a 6 cm aortic aneurysm  He was brought in for aortic aneurysm repair 5/8 He has renal artery stenosis and a long history of tobacco abuse  He had hypotension and poor urine output with a rising serum creatinine   Creatinine was 2  5/8   Increased to 3.45    5/9   Today is 3.5   He is oliguric and urine output has been poor  He has had aggressive volume resuscitation of about 10 L  Baseline creatinine was 0.97    As outpatient patient took mobic   No ACE or ARB use     Past Medical History:  Diagnosis Date  . Anxiety neurosis   . GERD (gastroesophageal reflux disease)   . Patellar tendonitis of right knee   . Peripheral vascular disease (HCC)   . Reflux esophagitis     Past Surgical History:  Procedure Laterality Date  . ABDOMINAL AORTIC ANEURYSM REPAIR Bilateral Dec 13, 2017   Procedure: REPAIR JUXTORENAL ABDOMINAL AORTIC ANEURYSM;  Surgeon: Sherren Kerns, MD;  Location: [facility] OR;  Service: Vascular;  Laterality: Bilateral;  . AORTA - BILATERAL FEMORAL ARTERY BYPASS GRAFT Bilateral 12-13-2017   Procedure: AORTA-BIFEMORAL BYPASS GRAFT USING 18 X9 MM X 40 CM HEMASHIELD GRAFT;  Surgeon: Sherren Kerns, MD;  Location: [facility] OR;  Service: Vascular;  Laterality: Bilateral;  . ENDARTERECTOMY FEMORAL Left 2017-12-13   Procedure: LEFT ENDARTERECTOMY FEMORAL USING PROFUNDA PLASTY;  Surgeon: Sherren Kerns, MD;  Location: [facility] OR;  Service: Vascular;  Laterality: Left;  Marland Kitchen MASTOID DEBRIDEMENT     "I had a mastoid"    Prior to Admission medications   Medication Sig Start Date End Date Taking? Authorizing Provider  aspirin EC 81 MG tablet Take 81 mg by mouth every evening.    Yes [provider]  atorvastatin (LIPITOR) 20 MG tablet Take 20 mg by mouth every evening.    Yes [provider]  busPIRone (BUSPAR)  7.5 MG tablet Take 15 mg by mouth 2 (two) times daily.   Yes [provider]  Doxylamine Succinate, Sleep, (SLEEP AID PO) Take 1 tablet by mouth at bedtime as needed (sleep).   Yes [provider]  Garlic 1000 MG CAPS Take 1,000 mg by mouth daily.   Yes [provider]  Ginseng 250 MG CAPS Take 250 mg by mouth daily.   Yes [provider]  meloxicam (MOBIC) 7.5 MG tablet Take 7.5 mg by mouth daily.   Yes [provider]  Omega-3 Fatty Acids (FISH OIL) 1000 MG CAPS Take 1,000 mg by mouth 3 (three) times daily.   Yes [provider]  omeprazole (PRILOSEC) 20 MG capsule Take 20 mg by mouth daily.   Yes [provider]  ranitidine (ZANTAC) 150 MG tablet Take 150 mg by mouth at bedtime.    Yes [provider]  sodium chloride (OCEAN) 0.65 % SOLN nasal spray Place 1 spray into both nostrils as needed for congestion.   Yes [provider]  vitamin B-12 (CYANOCOBALAMIN) 1000 MCG tablet Take 1,000 mcg by mouth every other day.   Yes [provider]  vitamin E 400 UNIT capsule Take 400 Units by mouth daily.   Yes [provider]    Current Facility-Administered Medications  Medication Dose Route Frequency Provider Last  Rate Last Dose  . 0.9 %  sodium chloride infusion  500 mL Intravenous Once PRN Emilie Rutter, PA-C      . 0.9 %  sodium chloride infusion   Intravenous Once Sherren Kerns, MD      . acetaminophen (TYLENOL) tablet 325-650 mg  325-650 mg Oral Q4H PRN Emilie Rutter, PA-C       Or  . acetaminophen (TYLENOL) suppository 325-650 mg  325-650 mg Rectal Q4H PRN Emilie Rutter, PA-C      . alum & mag hydroxide-simeth (MAALOX/MYLANTA) 200-200-20 MG/5ML suspension 15-30 mL  15-30 mL Oral Q2H PRN Emilie Rutter, PA-C      . aspirin EC tablet 81 mg  81 mg Oral QPM Emilie Rutter, PA-C      . atorvastatin (LIPITOR) tablet 20 mg  20 mg Oral QPM Eveland, Matthew, PA-C      . busPIRone  (BUSPAR) tablet 15 mg  15 mg Oral BID Emilie Rutter, PA-C      . dextrose 5 %-0.45 % sodium chloride infusion   Intravenous Continuous Sherren Kerns, MD 125 mL/hr at 12/11/2017 1254    . diphenhydrAMINE (BENADRYL) injection 12.5 mg  12.5 mg Intravenous Q6H PRN Fransisco Hertz, MD       Or  . diphenhydrAMINE (BENADRYL) 12.5 MG/5ML elixir 12.5 mg  12.5 mg Oral Q6H PRN Fransisco Hertz, MD      . docusate sodium (COLACE) capsule 100 mg  100 mg Oral Daily Emilie Rutter, PA-C      . famotidine (PEPCID) IVPB 20 mg premix  20 mg Intravenous Q24H Sherren Kerns, MD   Stopped at 12/04/2017 [phone]  . guaiFENesin-dextromethorphan (ROBITUSSIN DM) 100-10 MG/5ML syrup 15 mL  15 mL Oral Q4H PRN Emilie Rutter, PA-C      . heparin injection 5,000 Units  5,000 Units Subcutaneous Q8H Emilie Rutter, PA-C   5,000 Units at 12/04/2017 1336  . hydrALAZINE (APRESOLINE) injection 5 mg  5 mg Intravenous Q20 Min PRN Emilie Rutter, PA-C      . labetalol (NORMODYNE,TRANDATE) injection 10 mg  10 mg Intravenous Q10 min PRN Emilie Rutter, PA-C      . MEDLINE mouth rinse  15 mL Mouth Rinse BID Sherren Kerns, MD   15 mL at 11/28/2017 1000  . metoprolol tartrate (LOPRESSOR) injection 2-5 mg  2-5 mg Intravenous Q2H PRN Emilie Rutter, PA-C      . morphine 2 mg/mL PCA injection   Intravenous Q4H Fransisco Hertz, MD   Stopped at 12/01/17 1959  . morphine 4 MG/ML injection 4 mg  4 mg Intravenous Q1H PRN Emilie Rutter, PA-C   4 mg at 12/11/2017 0920  . naloxone [facility]) injection 0.4 mg  0.4 mg Intravenous PRN Fransisco Hertz, MD       And  . sodium chloride flush (NS) 0.9 % injection 9 mL  9 mL Intravenous PRN Fransisco Hertz, MD      . nicotine (NICODERM CQ - dosed in mg/24 hours) patch 14 mg  14 mg Transdermal Daily Sherren Kerns, MD   14 mg at 12/20/2017 1151  . omega-3 acid ethyl esters (LOVAZA) capsule 1 g  1 g Oral TID Jeanella Craze, Dwayne A, RPH      . ondansetron (ZOFRAN) injection 4 mg  4 mg Intravenous Q6H PRN Emilie Rutter, PA-C   4 mg at 12/23/2017 1810  . ondansetron (ZOFRAN) injection 4 mg  4 mg Intravenous Q6H PRN Fransisco Hertz, MD      .  phenol (CHLORASEPTIC) mouth spray 1 spray  1 spray Mouth/Throat PRN Emilie Rutter, PA-C   1 spray at 12/01/17 1008  . potassium chloride SA (K-DUR,KLOR-CON) CR tablet 20-40 mEq  20-40 mEq Oral Daily PRN Emilie Rutter, PA-C        Allergies as of 11/21/2017  . (No Known Allergies)    History reviewed. No pertinent family history.  Social History   Socioeconomic History  . Marital status: Married    Spouse name: Not on file  . Number of children: Not on file  . Years of education: Not on file  . Highest education level: Not on file  Occupational History  . Not on file  Social Needs  . Financial resource strain: Not on file  . Food insecurity:    Worry: Not on file    Inability: Not on file  . Transportation needs:    Medical: Not on file    Non-medical: Not on file  Tobacco Use  . Smoking status: Current Some Day Smoker    Packs/day: 1.00  . Smokeless tobacco: Never Used  . Tobacco comment: "trying to quit"  Substance and Sexual Activity  . Alcohol use: Yes    Frequency: Never    Comment: occasionally a beer  . Drug use: No  . Sexual activity: Not on file  Lifestyle  . Physical activity:    Days per week: Not on file    Minutes per session: Not on file  . Stress: Not on file  Relationships  . Social connections:    Talks on phone: Not on file    Gets together: Not on file    Attends religious service: Not on file    Active member of club or organization: Not on file    Attends meetings of clubs or organizations: Not on file    Relationship status: Not on file  . Intimate partner violence:    Fear of current or ex partner: Not on file    Emotionally abused: Not on file    Physically abused: Not on file    Forced sexual activity: Not on file  Other Topics Concern  . Not on file  Social History Narrative  . Not on file     Review of Systems: Patient intubated    Physical Exam: Vital signs in last 24 hours: Temp:  [97.8 F (36.6 C)-98.6 F (37 C)] 98.2 F (36.8 C) (05/10 0700) Resp:  [13-26] 24 (05/10 1300) BP: (77-120)/(50-74) 77/50 (05/10 1300) SpO2:  [87 %-96 %] 92 % (05/10 1300) Arterial Line BP: (76-123)/(49-62) 91/54 (05/10 1300) Last BM Date: (PTA) General:   Elderly male  Comfortable on ventilator Head:  Normocephalic and atraumatic. Eyes:  Sclera clear, no icterus.   Conjunctiva pink. Ears:  Normal auditory acuity. Nose:  No deformity, discharge,  or lesions. Mouth:  ETT in place  Neck:  Supple; no masses or thyromegaly. JVP not elevated Lungs:  Clear throughout to auscultation.   No wheezes, crackles, or rhonchi. No acute distress. Heart:  Regular rate and rhythm; no murmurs, clicks, rubs,  or gallops. Abdomen:   Hypoactive bowel sounds   Msk:  Symmetrical without gross deformities. Normal posture. Pulses:  No carotid, renal, femoral bruits. DP and PT symmetrical and equal Extremities:  Without clubbing   Pitting edema 1 + Neurologic:  Alert and  oriented x4;  grossly normal neurologically. Skin:  Intact without significant lesions or rashes.   Intake/Output from previous day: 05/09 0701 - 05/10 0700 In: 5012.2 [  I.V.:3792.2; NG/GT:120; IV Piggyback:1100] Out: 625 [Urine:425; Emesis/NG output:200] Intake/Output this shift: Total I/O In: 1800 [I.V.:1750; IV Piggyback:50] Out: 425 [Urine:375; Emesis/NG output:50]  Lab Results: Recent Labs    12/01/17 1614 12/18/2017 0346 12/01/2017 1137  WBC 16.2* 14.8* 13.8*  HGB 10.9* 9.8* 8.8*  HCT 34.1* 30.1* 26.2*  PLT 143* 120* 116*   BMET Recent Labs    12/01/17 0315 12/01/17 1614 11/28/2017 0346 11/30/2017 1137  NA 139  --  137 138  K 4.9  --  4.9 4.8  CL 107  --  109 115*  CO2 23  --  21* 16*  GLUCOSE 155*  --  124* 141*  BUN 23*  --  35* 35*  CREATININE 2.37* 3.45* 3.50* 3.02*  CALCIUM 7.6*  --  7.4* 6.9*   LFT Recent  Labs    12/01/17 0315  PROT 4.6*  ALBUMIN 3.0*  AST 31  ALT 17  ALKPHOS 53  BILITOT 0.7   PT/INR Recent Labs    12-Dec-2017 1640  LABPROT 15.9*  INR 1.28   Hepatitis Panel No results for input(s): HEPBSAG, HCVAB, HEPAIGM, HEPBIGM in the last 72 hours.  Studies/Results: Dg Chest Port 1 View  Result Date: 12-Dec-2017 CLINICAL DATA:  Abdominal aortic aneurysm EXAM: PORTABLE CHEST 1 VIEW COMPARISON:  03/01/2004 and chest CT from 10/25/2017 FINDINGS: Borderline cardiomegaly with aortic atherosclerosis at the arch and along the descending aorta. Low lung volumes with slight crowding of interstitial lung markings. Fine reticular lung markings consistent with patient's known centrilobular and paraseptal emphysematous change. 5 mm rounded density in the right mid lung likely represents a pulmonary vessel seen on end given lack of pulmonary nodule on recent chest CT. No alveolar consolidation is identified. Hazy opacity at the left lung base may represent a small left effusion versus atelectasis. Right IJ central line catheter is seen in the distal SVC. Gastric tube tip and side-port extend into the expected location of the stomach, below the left hemidiaphragm. IMPRESSION: 1. Low lung volumes with fine reticular interstitial lung markings compatible with changes of the patient's known centrilobular and paraseptal emphysema. 2. Borderline cardiomegaly with aortic atherosclerosis. 3. Satisfactory support line and tube positions. Electronically Signed   By: Tollie Eth M.D.   On: 2017/12/12 19:41   Dg Abd Portable 1v  Result Date: 12-Dec-2017 CLINICAL DATA:  Abdominal aortic aneurysm EXAM: PORTABLE ABDOMEN - 1 VIEW COMPARISON:  11/04/2017 CT FINDINGS: The bowel gas pattern is normal. Midline skin staples from L2 caudad to the lower pelvis. No radio-opaque calculi. Mild degenerative changes along the included thoracic and lumbar spine. IMPRESSION: Skin staples project over the mid and lower abdomen extending  into the pelvis. Bowel gas pattern is unremarkable. There is thoracolumbar degenerative change. Electronically Signed   By: Tollie Eth M.D.   On: 12-12-17 19:49    Assessment/Plan:     1. Acute renal failure  This appears to be most consistent with ATN from clamp post bypass  There also has been some hypotension. He has had no response to diuretics and continues to be oliguric   We shall proceed with CRRT 2. Volume overload  Clearly with volume overload and requiring intubation 3.  Anemia will check iron levels  4. Metabolic acidosis will correct with CRRT    LOS: 2 Faven Watterson W  :43 PM

## 2017-12-24 NOTE — Progress Notes (Signed)
Remains acidotic, hypotensive.  Give HCO3 push, add ABx for ?sepsis, and add vasopressin.  Coralyn Helling, MD [facility] Pulmonary/Critical Care 12/13/2017, 6:27 PM

## 2017-12-24 NOTE — Progress Notes (Signed)
PT Cancellation Note  Patient Details Name: WINFORD HEHN MRN: [id number] DOB: 01-03-1951   Cancelled Treatment:     Attempted to work with patient at 1030, RN reporting pt is tachy and lethargic, asks we hold for now. Will cont to follow.  Etta Grandchild, PT, DPT Acute Rehab Services Pager: [phone]     Etta Grandchild 11/24/2017, 10:22 AM

## 2017-12-24 NOTE — Progress Notes (Signed)
Initial Nutrition Assessment  DOCUMENTATION CODES:   Not applicable  INTERVENTION:   Recommend starting  Vital 1.5 @ 35 ml/hr (840 ml/day) 60 ml Prostat QID  Provides: 2060 kcal, 176 grams protein, and 641 ml free water.    NUTRITION DIAGNOSIS:   Increased nutrient needs related to catabolic illness as evidenced by estimated needs.  GOAL:   Patient will meet greater than or equal to 90% of their needs  MONITOR:   Vent status, I & O's  REASON FOR ASSESSMENT:   Ventilator    ASSESSMENT:   Pt with PMH of renal artery stenosis and long hx of tobacco abuse admitted for aortic aneurysm repair 5/8.    5/10 pt transferred to ICU and intubated NG tube remains in for post-op ileus. 200 ml out x 24 hr  Renal now following for ARF. Pt to start CRRT today for volume overload (resusitated with 10 L)  Patient is currently intubated on ventilator support MV: 14.2 L/min Temp (24hrs), Avg:98.3 F (36.8 C), Min:97.8 F (36.6 C), Max:98.6 F (37 C)  UOP: 425 ml x 24 hr  Medications reviewed and include: colace, omega 3, neo just started Labs reviewed   Pt currently in sterile procedure to have HD cath placed.  Unable to complete Nutrition-Focused physical exam at this time.    Diet Order:   Diet Order           Diet NPO time specified  Diet effective now          EDUCATION NEEDS:   No education needs have been identified at this time  Skin:  Skin Assessment: Reviewed RN Assessment(incisions)  Last BM:  unknown  Height:   Ht Readings from Last 1 Encounters:  11/25/17  (1.753 m)    Weight:   Wt Readings from Last 1 Encounters:  12-16-2017 184 lb (83.5 kg)    Ideal Body Weight:  72.7 kg  BMI:  Body mass index is 27.17 kg/m.  Estimated Nutritional Needs:   Kcal:  2000  Protein:  135-180 grams  Fluid:  2 L/day  Kendell Bane RD, LDN, CNSC [phone] Pager [phone] After Hours Pager

## 2017-12-24 NOTE — Progress Notes (Signed)
PT went PEA. Code Blue initiated. CPR 16 mins. Epi X 5 Bicarb X 2. Sood MD as well as code team at bedside

## 2017-12-24 NOTE — Progress Notes (Signed)
230 cc of fentanyl wasted into sink with Dessa Phi RN

## 2017-12-24 NOTE — Progress Notes (Signed)
Pt with minimal response to second lasix dose.  Pt is confused and tachycardic. Marginal oxygen sats despite mask. MIxed respiratory and renal induced metabolic acidosis.    Will get chest xray but most likely pulmonary edema  Have consult Dr Craige Cotta from pulmonary critical care to assist in putting pt back on ventilator and pulmonary management.  Renal consult pending  Family updated at bedside   Fabienne Bruns, MD [facility] Office: [phone] Pager: [phone]

## 2017-12-24 NOTE — Progress Notes (Signed)
[facility]  Subjective  - slightly less pain today.  Became lethargic and hypotensive yesterday evening so PCA reduced.   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 ATN making reasonable urine.  Creatinine still rising.  I believe he still is prerenal and 3rd spacing.  Will bolus with saline today  2. Acute blood loss anemia Hbg 9.8 trend  3. Ileus persists keep NG  4. OOB ambulate some today  5. Will transfer out of ICU when renal function stabilizes  6. Pain control continue reduced dose PCA  Fabienne Bruns 12/08/2017 8:27 AM --  Laboratory Lab Results: Recent Labs    12/01/17 1614 11/28/2017 0346  WBC 16.2* 14.8*  HGB 10.9* 9.8*  HCT 34.1* 30.1*  PLT 143* 120*   BMET Recent Labs    12/01/17 0315 12/01/17 1614 12/08/2017 0346  NA 139  --  137  K 4.9  --  4.9  CL 107  --  109  CO2 23  --  21*  GLUCOSE 155*  --  124*  BUN 23*  --  35*  CREATININE 2.37* 3.45* 3.50*  CALCIUM 7.6*  --  7.4*    COAG Lab Results  Component Value Date   INR 1.28 Dec 21, 2017   INR 0.97 11/29/2017   No results found for: PTT

## 2017-12-24 NOTE — Progress Notes (Signed)
   12/10/2017 2029  Clinical Encounter Type  Visited With Patient and family together;Health care provider  Visit Type Critical Care;Code;Death  Referral From Nurse  Consult/Referral To Chaplain  Spiritual Encounters  Spiritual Needs Grief support;Emotional;Prayer   Responded to a Code Blue.  Upon arrival family was just outside the room.  Not long after my arrival the Physician met with the family and the patient died.  Provided support for the family and we prayed together.  Gathered funeral home information and passed on to the nurse.  Stayed with family until everyone had left.   Chaplain Katherene Ponto

## 2017-12-24 NOTE — Significant Event (Addendum)
CODE BLUE NOTE   Re-intubated at noon today on [employer] Per RN prior to code CRRT was started Pt tolerated  < 1 hr HR was in 130s then dropped to 80s RN increased Levo to 40 despite increase of vasopressor Pt went into PEA arrest CODE BLUE called -19:27 ACLS protocol initiated.- initially managed by Resident team then PCCM called to bedside.  Pulse checks by direct palpation, doppler and pt has an A-line  Total meds given 2 bicarb 4 Epi Calc chloride x 1 Insulin- 10 units D50- 1 amp   Lines and tubes: Double lumen RIJ  HD cath in LIJ R radial A-line Intubated  ROSC- 1937  BP continued to drop despite  Epi ggt, Levo ggt vaso ggt and Neo-all at max doses ACLS initiated again when pulse was lost shortly after Time of death 21  Discussed with Family who was at bedside (wife and sons present) Explained that despite aggressive medical interventions we are unable to achieve sustainable ROSC.   Signed Dr Newell Coral Pulmonary Critical Care Locums

## 2017-12-24 NOTE — Code Documentation (Signed)
Patient developed PEA arrest.  Started CPR.  Given Epi, HCO3 x several doses.  Had ROSC after 16 minutes.  Will start epi and HCO3 gtt's.  Updated pt's wife at bedside.  Coralyn Helling, MD [facility] Pulmonary/Critical Care 12/06/2017, 5:49 PM

## 2017-12-24 NOTE — Progress Notes (Signed)
Pharmacy Antibiotic Note  DOMINIK YORDY is a 67 y.o. male admitted on 12/15/2017 for open repair of juxtarenal AAA.  Hospital course complicated by AKI requiring CRRT and code blue now intubated.  Pharmacy has been consulted for vancomycin and cefepime dosing for possible sepsis.  Afebrile, WBC 13.8.   Plan: Vanc  IV x 1, then  IV Q24H Cefepime 2gm IV Q12H Monitor CRRT tolerance/interruption, clinical progress, vanc level as indicated   Weight: 184 lb 1.4 oz (83.5 kg)  Temp (24hrs), Avg:97.1 F (36.2 C), Min:91.2 F (32.9 C), Max:99.3 F (37.4 C)  Recent Labs  Lab 12/11/2017 1640 11/29/2017 2231 12/01/17 0315 12/01/17 1614 Dec 03, 2017 0346 Dec 03, 2017 1137  WBC 15.9*  --  10.8* 16.2* 14.8* 13.8*  CREATININE 1.58* 2.03* 2.37* 3.45* 3.50* 3.02*    Estimated Creatinine Clearance: 24.4 mL/min (A) (by C-G formula based on SCr of 3.02 mg/dL (H)).    No Known Allergies   Vanc 5/10 >> Cefepime 5/10 >>   Deneane Stifter D. Laney Potash, PharmD, BCPS, BCCCP Pager:  [phone] 12/03/17, 6:32 PM

## 2017-12-24 NOTE — Progress Notes (Signed)
Pt with some decreased O2 sat this afternoon Seems to be responding to first dose of Lasix Will have renal see today.  Hopefully creatinine is plateauing Will have low threshold to reintubate if work of breathing increases or O2 sats continue to drop secondary to pulmonary edema from fluid overload  Fabienne Bruns, MD [facility] Office: [phone] Pager: [phone]

## 2017-12-24 NOTE — Progress Notes (Signed)
   12/11/2017 1812  Clinical Encounter Type  Visited With Patient;Family;Health care provider  Visit Type Code;Critical Care  Referral From Nurse  Consult/Referral To Chaplain  Spiritual Encounters  Spiritual Needs Emotional  Stress Factors  Family Stress Factors Health changes   Responded to a Code Blue for this patient.  Wife (of 2 years) son and brother just outside the room.  Wife did not want to leave the doorway.  Stayed with the family.  Patient was "stablized" and wife and family were able to go back to bedside.  Wife let me know her granddaughter has gone top the hospital today to give birth in [place].  Will check back and support this family. Chaplain Agustin Cree

## 2017-12-24 NOTE — Code Documentation (Signed)
CODE BLUE NOTE  Patient Name: Donald Poole   MRN: [id number]   Date of Birth/ Sex: 1950-08-20 , male      Admission Date: 12/20/2017  Attending Provider: Sherren Kerns, MD  Primary Diagnosis: <principal problem not specified>    Indication: Pt was in intubated and on CRT for approximately 1 hr, when he was noted to become hypotensive in 30/15. Code blue was subsequently called. At the time of arrival on scene, ACLS protocol was underway.    Technical Description:  - CPR performance duration:  16 minutes  - Was defibrillation or cardioversion used? No   - Was external pacer placed? No  - Was patient intubated pre/post CPR? PRe    Medications Administered: Y = Yes; Blank = No Amiodarone    Atropine    Calcium    Epinephrine  y (and drip was started)  Lidocaine    Magnesium    Norepinephrine  (already on)  Phenylephrine  (already on )  Sodium bicarbonate  y  Vasopressin      Post CPR evaluation:  - Final Status - Was patient successfully resuscitated ? Yes - What is current rhythm? Sinus rhythm - What is current hemodynamic status? 80/50   Miscellaneous Information:  - Labs sent, including: none  - Primary team notified?  Yes  - Family Notified? Yes  - Additional notes/ transfer status: Already in ICU        Garnette Gunner, MD  20-Dec-2017, 5:48 PM

## 2017-12-24 NOTE — Consult Note (Signed)
PULMONARY / CRITICAL CARE MEDICINE   Name: Donald Poole MRN: [id number] DOB: [birth year]    ADMISSION DATE:  12/07/2017 CONSULTATION DATE:  12-30-2017  REFERRING MD:  Dr. Darrick Penna, VVS  CHIEF COMPLAINT:  Short of breath  HISTORY OF PRESENT ILLNESS:   Hx from medical/surgical team and chart.  67 yo male smoker with 6 cm AAA.  Had aortobifemoral bypass with Lt common femoral endarterectomy and profundoplasty for repair of juxtarenal AAA on 11/30/17.  Developed ATN post op with acute pulmonary edema and hypoxic respiratory failure.  Required intubation and plan for CRRT.    PAST MEDICAL HISTORY :  He  has a past medical history of Anxiety neurosis, GERD (gastroesophageal reflux disease), Patellar tendonitis of right knee, Peripheral vascular disease (HCC), and Reflux esophagitis.  PAST SURGICAL HISTORY: He  has a past surgical history that includes Mastoid debridement; Aorta - bilateral femoral artery bypass graft (Bilateral, 12/21/2017); Abdominal aortic aneurysm repair (Bilateral, 11/28/2017); and Endarterectomy femoral (Left, 12/14/2017).  No Known Allergies  No current facility-administered medications on file prior to encounter.    Current Outpatient Medications on File Prior to Encounter  Medication Sig  . aspirin EC 81 MG tablet Take 81 mg by mouth every evening.   Marland Kitchen atorvastatin (LIPITOR) 20 MG tablet Take 20 mg by mouth every evening.   . busPIRone (BUSPAR) 7.5 MG tablet Take 15 mg by mouth 2 (two) times daily.  . Doxylamine Succinate, Sleep, (SLEEP AID PO) Take 1 tablet by mouth at bedtime as needed (sleep).  . Garlic 1000 MG CAPS Take 1,000 mg by mouth daily.  . Ginseng 250 MG CAPS Take 250 mg by mouth daily.  . meloxicam (MOBIC) 7.5 MG tablet Take 7.5 mg by mouth daily.  . Omega-3 Fatty Acids (FISH OIL) 1000 MG CAPS Take 1,000 mg by mouth 3 (three) times daily.  Marland Kitchen omeprazole (PRILOSEC) 20 MG capsule Take 20 mg by mouth daily.  . ranitidine (ZANTAC) 150 MG tablet Take 150 mg by  mouth at bedtime.   . sodium chloride (OCEAN) 0.65 % SOLN nasal spray Place 1 spray into both nostrils as needed for congestion.  . vitamin B-12 (CYANOCOBALAMIN) 1000 MCG tablet Take 1,000 mcg by mouth every other day.  . vitamin E 400 UNIT capsule Take 400 Units by mouth daily.    FAMILY HISTORY:  His has no family status information on file.    SOCIAL HISTORY: He  reports that he has been smoking.  He has been smoking about 1.00 pack per day. He has never used smokeless tobacco. He reports that he drinks alcohol. He reports that he does not use drugs.  REVIEW OF SYSTEMS:   Unable to obtain.  SUBJECTIVE:   VITAL SIGNS: BP (!) 78/54   Pulse (!) 136   Temp 98.2 F (36.8 C) (Oral)   Resp (!) 26   Wt 184 lb (83.5 kg)   SpO2 93%   BMI 27.17 kg/m   HEMODYNAMICS: CVP:  [3 mmHg-19 mmHg] 15 mmHg  VENTILATOR SETTINGS: Vent Mode: PRVC FiO2 (%):  [100 %] 100 % Set Rate:  [26 bmp] 26 bmp Vt Set:  [560 mL] 560 mL PEEP:  [10 cmH20] 10 cmH20 Plateau Pressure:  [22 cmH20] 22 cmH20  INTAKE / OUTPUT: I/O last 3 completed shifts: In: 6000.9 [I.V.:4680.9; NG/GT:120; IV Piggyback:1200] Out: 1025 [Urine:815; Emesis/NG output:210]  PHYSICAL EXAMINATION:  General - ill appearing, using accessory muscles Eyes - pupils reactive ENT - no stridor Cardiac - regular, tachycardic, no murmur Chest -  b/l crackles Abd - wound sites clean Ext - 1+ edema Skin - no rashes Neuro - moves all extremities  LABS:  BMET Recent Labs  Lab 12/01/17 0315 12/01/17 1614 12/08/2017 0346 12/01/2017 1137  NA 139  --  137 138  K 4.9  --  4.9 4.8  CL 107  --  109 115*  CO2 23  --  21* 16*  BUN 23*  --  35* 35*  CREATININE 2.37* 3.45* 3.50* 3.02*  GLUCOSE 155*  --  124* 141*    Electrolytes Recent Labs  Lab 12-25-17 1640  12/01/17 0315 12/18/2017 0346 12/21/2017 1137  CALCIUM 7.6*   < > 7.6* 7.4* 6.9*  MG 1.6*  --  1.5*  --   --    < > = values in this interval not displayed.    CBC Recent  Labs  Lab 12/01/17 1614 11/27/2017 0346 12/08/2017 1137  WBC 16.2* 14.8* 13.8*  HGB 10.9* 9.8* 8.8*  HCT 34.1* 30.1* 26.2*  PLT 143* 120* 116*    Coag's Recent Labs  Lab 11/29/17 1418 2017/12/25 1640  APTT 32 29  INR 0.97 1.28    Sepsis Markers No results for input(s): LATICACIDVEN, PROCALCITON, O2SATVEN in the last 168 hours.  ABG Recent Labs  Lab 12/25/2017 1640 12/15/2017 1146 12/12/2017 1317  PHART 7.340* 7.357 7.368  PCO2ART 39.6 26.9* 24.8*  PO2ART 73.8* 58.0* 56.0*    Liver Enzymes Recent Labs  Lab 11/29/17 1418 12/01/17 0315  AST 25 31  ALT 25 17  ALKPHOS 92 53  BILITOT 1.0 0.7  ALBUMIN 3.9 3.0*    Cardiac Enzymes No results for input(s): TROPONINI, PROBNP in the last 168 hours.  Glucose No results for input(s): GLUCAP in the last 168 hours.  Imaging No results found.   STUDIES:  Echo 5/11 >>   CULTURES:  ANTIBIOTICS:  SIGNIFICANT EVENTS: 5/08 Admit, AAA repair 5/10 VDRF, start CRRT, start pressors  LINES/TUBES: Rt IJ CVL 5/08 >> Aline 5/08 >> ETT 5/10 >> Lt IJ HD cath 5/10 >>   DISCUSSION: 67 yo male with acute pulmonary edema, acute renal failure, acute respiratory failure after AAA repair.  ASSESSMENT / PLAN:  Acute respiratory failure with hypoxia. - full vent support - PEEP/FiO2 to keep SpO2 > 92% - f/u CXR, ABG - prn BDs  Hypotension. Hx of HLD. - check echo, cardiac enzymes - pressors to keep MAP > 65  Acute renal failure from ATN. Metabolic acidosis. - CRRT per renal  Anemia of critical illness. Thrombocytopenia. - f/u CBC  Hyperglycemia. - SSI  Acute metabolic encephalopathy. - RASS goal 0 to -1  DVT prophylaxis - SCDs SUP - Pepcid Nutrition - NPO Goals of care - full code  D/w Dr. Darrick Penna and Dr. Hyman Hopes  CC time 38 minutes  Coralyn Helling, MD [facility] Pulmonary/Critical Care 12/09/2017, 3:50 PM

## 2017-12-24 NOTE — Procedures (Signed)
Intubation Procedure Note MECHEL SCHUTTER [id number] June 04, 1951  Procedure: Intubation Indications: Respiratory insufficiency  Procedure Details Consent: Risks of procedure as well as the alternatives and risks of each were explained to the (patient/caregiver).  Consent for procedure obtained. Time Out: Verified patient identification, verified procedure, site/side was marked, verified correct patient position, special equipment/implants available, medications/allergies/relevent history reviewed, required imaging and test results available.  Performed  Maximum sterile technique was used including gloves and hand hygiene.  MAC and 3  Given 2 mg versed, 100 mcg fentanyl, 20 mg etomidate, 70 mg rocuronium.  Inserted 7.5 ETT to 23 cm at lip using glidescope.  Confirmed with CO2 detector and auscultation.  Evaluation Hemodynamic Status: BP stable throughout; O2 sats: transiently fell during during procedure Patient's Current Condition: stable Complications: No apparent complications Patient did tolerate procedure well. Chest X-ray ordered to verify placement.  CXR: pending.   Chesley Mires, MD [facility] Pulmonary/Critical Care Dec 20, 2017, 3:37 PM

## 2017-12-24 NOTE — Progress Notes (Deleted)
Using both PCA and PRN injection - spoke to RN - he's going to minimize the amount of "extra" doses.  If he continues to get we should probably just inc PCA dose

## 2017-12-24 DEATH — deceased

## 2018-01-17 ENCOUNTER — Encounter: Admit: 2018-01-17 | Discharge: 2018-01-17 | Payer: MEDICARE

## 2018-01-17 ENCOUNTER — Ambulatory Visit: Admit: 2018-01-17 | Discharge: 2018-01-17 | Payer: MEDICARE

## 2018-01-17 ENCOUNTER — Ambulatory Visit: Admit: 2018-01-17 | Discharge: 2018-01-18 | Payer: MEDICARE

## 2018-01-17 DIAGNOSIS — G935 Compression of brain: ICD-10-CM

## 2018-01-17 DIAGNOSIS — K219 Gastro-esophageal reflux disease without esophagitis: ICD-10-CM

## 2018-01-17 DIAGNOSIS — E785 Hyperlipidemia, unspecified: ICD-10-CM

## 2018-01-17 DIAGNOSIS — R55 Syncope and collapse: Principal | ICD-10-CM

## 2018-01-17 DIAGNOSIS — R51 Headache: ICD-10-CM

## 2018-01-17 DIAGNOSIS — I951 Orthostatic hypotension: ICD-10-CM

## 2018-01-17 DIAGNOSIS — E6609 Other obesity due to excess calories: ICD-10-CM

## 2018-01-17 DIAGNOSIS — R42 Dizziness and giddiness: Secondary | ICD-10-CM

## 2018-01-17 DIAGNOSIS — R0602 Shortness of breath: ICD-10-CM

## 2018-01-17 DIAGNOSIS — R1319 Other dysphagia: ICD-10-CM

## 2018-01-17 DIAGNOSIS — M1990 Unspecified osteoarthritis, unspecified site: ICD-10-CM

## 2018-01-17 DIAGNOSIS — Z6836 Body mass index (BMI) 36.0-36.9, adult: ICD-10-CM

## 2018-01-17 DIAGNOSIS — R238 Other skin changes: ICD-10-CM

## 2018-01-17 DIAGNOSIS — G479 Sleep disorder, unspecified: ICD-10-CM

## 2018-01-17 DIAGNOSIS — D689 Coagulation defect, unspecified: ICD-10-CM

## 2018-01-17 DIAGNOSIS — G47419 Narcolepsy without cataplexy: ICD-10-CM

## 2018-01-17 DIAGNOSIS — R0600 Dyspnea, unspecified: ICD-10-CM

## 2018-01-17 DIAGNOSIS — R079 Chest pain, unspecified: ICD-10-CM

## 2018-01-17 DIAGNOSIS — N200 Calculus of kidney: ICD-10-CM

## 2018-01-17 DIAGNOSIS — G43909 Migraine, unspecified, not intractable, without status migrainosus: ICD-10-CM

## 2018-01-17 LAB — CBC AND DIFF
Lab: 0.1 10*3/uL (ref 0–0.20)
Lab: 0.1 10*3/uL (ref 0–0.45)
Lab: 0.6 10*3/uL (ref 0–0.80)
Lab: 1 % (ref >60)
Lab: 1 % (ref >60)
Lab: 2.3 10*3/uL (ref 1.0–4.8)
Lab: 28 % (ref 24–44)
Lab: 49 % (ref 40–50)
Lab: 5.3 10*3/uL (ref 1.8–7.0)
Lab: 5.3 M/UL (ref 4.4–5.5)
Lab: 62 % (ref 41–77)
Lab: 8 % (ref 4–12)
Lab: 8.4 K/UL (ref 4.5–11.0)

## 2018-01-17 LAB — LIPID PROFILE
Lab: 107 mg/dL (ref 7–11)
Lab: 148 mg/dL (ref <200)
Lab: 204 mg/dL — ABNORMAL HIGH (ref <150)
Lab: 41 mg/dL (ref 150–400)
Lab: 41 mg/dL (ref >40)
Lab: 80 mg/dL — ABNORMAL HIGH (ref <100)

## 2018-01-17 LAB — COMPREHENSIVE METABOLIC PANEL
Lab: 140 MMOL/L (ref 137–147)
Lab: 3.8 MMOL/L (ref 3.5–5.1)

## 2018-01-17 LAB — TSH WITH FREE T4 REFLEX: Lab: 0.5 uU/mL (ref 0.35–5.00)

## 2018-01-18 ENCOUNTER — Encounter: Admit: 2018-01-18 | Discharge: 2018-01-18 | Payer: MEDICARE

## 2018-01-18 DIAGNOSIS — E785 Hyperlipidemia, unspecified: Principal | ICD-10-CM

## 2018-01-18 MED ORDER — ATORVASTATIN 20 MG PO TAB
20 mg | ORAL_TABLET | Freq: Every day | ORAL | 3 refills | Status: AC
Start: 2018-01-18 — End: 2018-01-18

## 2018-01-18 MED ORDER — ATORVASTATIN 20 MG PO TAB
20 mg | ORAL_TABLET | Freq: Every day | ORAL | 3 refills | Status: AC
Start: 2018-01-18 — End: ?

## 2018-01-19 ENCOUNTER — Encounter: Admit: 2018-01-19 | Discharge: 2018-01-19 | Payer: MEDICARE

## 2018-01-20 ENCOUNTER — Encounter: Admit: 2018-01-20 | Discharge: 2018-01-20 | Payer: MEDICARE

## 2018-01-20 MED ORDER — ESCITALOPRAM OXALATE 10 MG PO TAB
ORAL_TABLET | 11 refills | Status: AC
Start: 2018-01-20 — End: 2018-03-14

## 2018-02-06 ENCOUNTER — Encounter: Admit: 2018-02-06 | Discharge: 2018-02-06 | Payer: MEDICARE

## 2018-02-15 ENCOUNTER — Ambulatory Visit: Admit: 2018-02-15 | Discharge: 2018-02-15 | Payer: MEDICARE

## 2018-02-15 ENCOUNTER — Encounter: Admit: 2018-02-15 | Discharge: 2018-02-15 | Payer: MEDICARE

## 2018-02-15 DIAGNOSIS — I951 Orthostatic hypotension: ICD-10-CM

## 2018-02-15 DIAGNOSIS — R42 Dizziness and giddiness: ICD-10-CM

## 2018-02-15 DIAGNOSIS — R55 Syncope and collapse: Principal | ICD-10-CM

## 2018-02-19 ENCOUNTER — Encounter: Admit: 2018-02-19 | Discharge: 2018-02-19 | Payer: MEDICARE

## 2018-02-20 ENCOUNTER — Ambulatory Visit: Admit: 2018-02-20 | Discharge: 2018-02-21 | Payer: MEDICARE

## 2018-02-20 MED ORDER — REGADENOSON 0.4 MG/5 ML IV SYRG
.4 mg | Freq: Once | INTRAVENOUS | 0 refills | Status: CP
Start: 2018-02-20 — End: ?

## 2018-02-21 ENCOUNTER — Encounter: Admit: 2018-02-21 | Discharge: 2018-02-21 | Payer: MEDICARE

## 2018-03-13 ENCOUNTER — Encounter: Admit: 2018-03-13 | Discharge: 2018-03-13 | Payer: MEDICARE

## 2018-03-14 MED ORDER — ESCITALOPRAM OXALATE 10 MG PO TAB
ORAL_TABLET | Freq: Every day | 5 refills | Status: AC
Start: 2018-03-14 — End: 2018-10-24

## 2018-03-28 ENCOUNTER — Encounter: Admit: 2018-03-28 | Discharge: 2018-03-28 | Payer: MEDICARE

## 2018-03-28 ENCOUNTER — Ambulatory Visit: Admit: 2018-03-28 | Discharge: 2018-03-29 | Payer: MEDICARE

## 2018-03-28 DIAGNOSIS — R0600 Dyspnea, unspecified: ICD-10-CM

## 2018-03-28 DIAGNOSIS — G4733 Obstructive sleep apnea (adult) (pediatric): ICD-10-CM

## 2018-03-28 DIAGNOSIS — E6609 Other obesity due to excess calories: ICD-10-CM

## 2018-03-28 DIAGNOSIS — E785 Hyperlipidemia, unspecified: ICD-10-CM

## 2018-03-28 DIAGNOSIS — R1319 Other dysphagia: ICD-10-CM

## 2018-03-28 DIAGNOSIS — R079 Chest pain, unspecified: ICD-10-CM

## 2018-03-28 DIAGNOSIS — G47419 Narcolepsy without cataplexy: ICD-10-CM

## 2018-03-28 DIAGNOSIS — F329 Major depressive disorder, single episode, unspecified: Principal | ICD-10-CM

## 2018-03-28 DIAGNOSIS — G43909 Migraine, unspecified, not intractable, without status migrainosus: ICD-10-CM

## 2018-03-28 DIAGNOSIS — R51 Headache: ICD-10-CM

## 2018-03-28 DIAGNOSIS — R42 Dizziness and giddiness: ICD-10-CM

## 2018-03-28 DIAGNOSIS — K219 Gastro-esophageal reflux disease without esophagitis: ICD-10-CM

## 2018-03-28 DIAGNOSIS — G479 Sleep disorder, unspecified: ICD-10-CM

## 2018-03-28 DIAGNOSIS — M1990 Unspecified osteoarthritis, unspecified site: ICD-10-CM

## 2018-03-28 DIAGNOSIS — N200 Calculus of kidney: ICD-10-CM

## 2018-03-28 DIAGNOSIS — D689 Coagulation defect, unspecified: ICD-10-CM

## 2018-03-28 MED ORDER — DEXTROAMPHETAMINE 10 MG PO TAB
ORAL_TABLET | Freq: Three times a day (TID) | 0 refills | Status: AC
Start: 2018-03-28 — End: 2018-03-28

## 2018-03-28 MED ORDER — ESCITALOPRAM OXALATE 10 MG PO TAB
ORAL_TABLET | 11 refills | Status: AC
Start: 2018-03-28 — End: 2018-10-24

## 2018-03-28 MED ORDER — DEXTROAMPHETAMINE 10 MG PO TAB
ORAL_TABLET | Freq: Three times a day (TID) | 0 refills | Status: AC
Start: 2018-03-28 — End: 2018-08-15

## 2018-03-29 ENCOUNTER — Encounter: Admit: 2018-03-29 | Discharge: 2018-03-29 | Payer: MEDICARE

## 2018-03-31 ENCOUNTER — Encounter: Admit: 2018-03-31 | Discharge: 2018-03-31 | Payer: MEDICARE

## 2018-04-05 ENCOUNTER — Ambulatory Visit: Payer: Medicare HMO

## 2018-04-05 ENCOUNTER — Encounter (HOSPITAL_COMMUNITY): Payer: Medicare HMO

## 2018-04-11 ENCOUNTER — Encounter: Admit: 2018-04-11 | Discharge: 2018-04-11 | Payer: MEDICARE

## 2018-04-11 DIAGNOSIS — G4733 Obstructive sleep apnea (adult) (pediatric): Principal | ICD-10-CM

## 2018-08-15 ENCOUNTER — Encounter: Admit: 2018-08-15 | Discharge: 2018-08-15 | Payer: MEDICARE

## 2018-08-15 MED ORDER — DEXTROAMPHETAMINE 10 MG PO TAB
ORAL_TABLET | Freq: Three times a day (TID) | 0 refills | Status: AC
Start: 2018-08-15 — End: 2018-08-15

## 2018-08-15 MED ORDER — DEXTROAMPHETAMINE 10 MG PO TAB
ORAL_TABLET | Freq: Three times a day (TID) | 0 refills | Status: AC
Start: 2018-08-15 — End: 2019-01-01

## 2018-10-23 ENCOUNTER — Encounter: Admit: 2018-10-23 | Discharge: 2018-10-23 | Payer: MEDICARE

## 2018-10-24 ENCOUNTER — Ambulatory Visit: Admit: 2018-10-24 | Discharge: 2018-10-25 | Payer: MEDICARE

## 2018-10-24 DIAGNOSIS — G47419 Narcolepsy without cataplexy: ICD-10-CM

## 2018-10-24 DIAGNOSIS — G4733 Obstructive sleep apnea (adult) (pediatric): Principal | ICD-10-CM

## 2018-10-24 MED ORDER — ESCITALOPRAM OXALATE 10 MG PO TAB
ORAL_TABLET | 3 refills | Status: AC
Start: 2018-10-24 — End: 2019-04-16

## 2018-10-24 MED ORDER — DEXTROAMPHETAMINE-AMPHETAMINE 30 MG PO TAB
ORAL_TABLET | Freq: Three times a day (TID) | 0 refills | Status: AC
Start: 2018-10-24 — End: 2019-04-23

## 2018-10-24 NOTE — Progress Notes
???   phenylephrine (NEO-SYNEPHRINE) 1 % spry nasal spray Apply 2 Sprays to each nostril as directed every 6 hours as needed.   ??? tamsulosin (FLOMAX) 0.4 mg capsule Take 1 Cap by mouth daily. Take 30 min after same meal.  Do not cut/ crush/ chew.     There were no vitals filed for this visit.  There is no height or weight on file to calculate BMI.     Physical Exam  Phone visit       Assessment and Plan:    Problem   Osa On Cpap    Diagnosed in 2011  Titration study in 2014 with AHI of 79, Sleep efficeincy of 82%, REM latency 93 mins, No PLMD's  Titrated to 10 cm H20    Machine: [employer]kel   DME: [facility] in [place]          OSA on CPAP      Naroclepsy - d/c dextrostat and change to adderall 30 mg tid.  Discussed risks/benefits/SE/alternatives.  Potential side effects with stimulants include: headache, tremor, palpitations (strong, rapid heart beat), irritability among others.  All of these side effects can be worsened when caffeine is used at the same time as the stimulants.      Restart Lexapro 15 mg - the patient is benefiting with no side effects.  Continue present management.    CPAP going well.  Download reviewed.    22 min phone conversation.  He was on the phone and wife helped in the background.

## 2018-10-29 ENCOUNTER — Encounter: Admit: 2018-10-29 | Discharge: 2018-10-29 | Payer: MEDICARE

## 2018-10-29 DIAGNOSIS — G47419 Narcolepsy without cataplexy: Principal | ICD-10-CM

## 2019-01-01 ENCOUNTER — Encounter: Admit: 2019-01-01 | Discharge: 2019-01-01

## 2019-01-01 MED ORDER — DEXTROAMPHETAMINE 10 MG PO TAB
ORAL_TABLET | Freq: Three times a day (TID) | 0 refills | Status: DC
Start: 2019-01-01 — End: 2019-06-24

## 2019-01-01 MED ORDER — DEXTROAMPHETAMINE 10 MG PO TAB
ORAL_TABLET | Freq: Three times a day (TID) | 0 refills | Status: DC
Start: 2019-01-01 — End: 2020-02-01

## 2019-01-01 MED ORDER — DEXTROAMPHETAMINE 10 MG PO TAB
ORAL_TABLET | Freq: Three times a day (TID) | 0 refills | Status: DC
Start: 2019-01-01 — End: 2019-08-14

## 2019-01-01 NOTE — Telephone Encounter
Refill request for Dextrostat. Pt states he would rather take this instead of Adderall.  Per protocol refill routed to Dr Gerilyn Nestle for approval.

## 2019-04-14 ENCOUNTER — Encounter: Admit: 2019-04-14 | Discharge: 2019-04-14 | Payer: MEDICARE

## 2019-04-16 MED ORDER — ESCITALOPRAM OXALATE 10 MG PO TAB
ORAL_TABLET | Freq: Every day | 2 refills | Status: DC
Start: 2019-04-16 — End: 2019-11-08

## 2019-04-16 MED ORDER — ESCITALOPRAM OXALATE 10 MG PO TAB
ORAL_TABLET | Freq: Every day | ORAL | 2 refills | 30.00000 days | Status: DC
Start: 2019-04-16 — End: 2019-04-16

## 2019-04-23 ENCOUNTER — Encounter

## 2019-04-23 DIAGNOSIS — R51 Headache: Secondary | ICD-10-CM

## 2019-04-23 DIAGNOSIS — E785 Hyperlipidemia, unspecified: Secondary | ICD-10-CM

## 2019-04-23 DIAGNOSIS — K219 Gastro-esophageal reflux disease without esophagitis: Secondary | ICD-10-CM

## 2019-04-23 DIAGNOSIS — E6609 Other obesity due to excess calories: Secondary | ICD-10-CM

## 2019-04-23 DIAGNOSIS — N200 Calculus of kidney: Secondary | ICD-10-CM

## 2019-04-23 DIAGNOSIS — D689 Coagulation defect, unspecified: Secondary | ICD-10-CM

## 2019-04-23 DIAGNOSIS — G43909 Migraine, unspecified, not intractable, without status migrainosus: Secondary | ICD-10-CM

## 2019-04-23 DIAGNOSIS — G4733 Obstructive sleep apnea (adult) (pediatric): Secondary | ICD-10-CM

## 2019-04-23 DIAGNOSIS — R079 Chest pain, unspecified: Secondary | ICD-10-CM

## 2019-04-23 DIAGNOSIS — R0600 Dyspnea, unspecified: Secondary | ICD-10-CM

## 2019-04-23 DIAGNOSIS — G479 Sleep disorder, unspecified: Secondary | ICD-10-CM

## 2019-04-23 DIAGNOSIS — R42 Dizziness and giddiness: Secondary | ICD-10-CM

## 2019-04-23 DIAGNOSIS — G47419 Narcolepsy without cataplexy: Secondary | ICD-10-CM

## 2019-04-23 DIAGNOSIS — M1990 Unspecified osteoarthritis, unspecified site: Secondary | ICD-10-CM

## 2019-04-23 DIAGNOSIS — R1319 Other dysphagia: Secondary | ICD-10-CM

## 2019-04-23 MED ORDER — ARMODAFINIL 250 MG PO TAB
ORAL_TABLET | 1 refills | Status: DC
Start: 2019-04-23 — End: 2019-11-08

## 2019-04-23 NOTE — Assessment & Plan Note
-   Add Nuvigil 250mg  PO daily. Discussed risks/benefits/SE/alternatives.  - Continue Dextrostat 20mg  PO TID for now. Discussed risks/benefits/SE/alternatives.  - Advised to avoid alcohol and sedatives. As well, advised to avoid driving or operating heavy machinery if sleepy. Patient voiced understanding.

## 2019-04-23 NOTE — Progress Notes
Date of Service: 04/23/2019    Subjective:             Derrick King is a 68 y.o. male.    History of Present Illness  Derrick King is a 68 yo M with PMH OSA, narcolepsy, HLD, arthritis who presents to [facility] for OSA and narcolepsy follow up.     OSA:   Wearing CPAP machine every night, no issues  CPAP Pressure 10 cmH20  Weight changes: None since new year  Snoring: not if wearing CPAP  Uses nasal pillows  Leak: minimal  Congestion/dry mouth: Not bad right now.  Machine age: couple years old  DME: [facility], gets supplies regularly    Narcolepsy:   - Stopped dextrostat and transitioned to Adderall for a month, but did not work well and is back on Dextrostat 20mg  PO TID. Denies SE. Says if he is having a long day he will increase to QID instead of TID. Usually only uses BID.   Bedtime: 9:30 pm to 1 am; usually finishing paperwork  Wake up time: 7 am  Awakenings: occasionally up to 1x per night  Total sleep time: 6 hours  Position: back and sides  Naps: occasional 15 to 120 minutes  Cataplexy: none  Hypnagogic/Hypnopompic hallucinations: None  Sleep paralysis: None  Restless legs/Leg cramps: Not during night. Will get occasional leg cramps.   Work: Semi-retired    ESS 11/24 today       Review of Systems   Constitutional: Positive for diaphoresis and fatigue.   Skin: Positive for rash.   All other systems reviewed and are negative.        Objective:         ? aspirin/acetaminophen/caffeine(+) (EXCEDRIN MIGRAINE) 250/250/65 mg tab Take 1 Tab by mouth every 8 hours as needed.   ? atorvastatin (LIPITOR) 20 mg tablet Take one tablet by mouth daily. at Bedtime   ? Cholecalciferol (Vitamin D3) (VITAMIN D-3) 2,000 unit cap Take 2,000 Units by mouth daily.   ? dextroamphetamine (DEXTROSTAT) 10 mg tablet Indications: recurring sleep episodes during the day  2 po tid   ? dextroamphetamine (DEXTROSTAT) 10 mg tablet Indications: recurring sleep episodes during the day  2 po tid ? dextroamphetamine (DEXTROSTAT) 10 mg tablet Indications: recurring sleep episodes during the day  2 po tid   ? escitalopram oxalate (LEXAPRO) 10 mg tablet TAKE 1 & 1/2 (ONE & ONE-HALF) TABLETS BY MOUTH ONCE DAILY   ? LORATADINE/PSEUDOEPHEDRINE (CLARITIN-D 24 HOUR PO) Take 1 tablet by mouth as Needed.   ? montelukast (SINGULAIR) 10 mg tablet Take 10 mg by mouth at bedtime daily.   ? omeprazole DR(+) (PRILOSEC) 20 mg PO capsule Take 20 mg by mouth twice daily (at 10AM and 10PM).   ? phenylephrine (NEO-SYNEPHRINE) 1 % spry nasal spray Apply 2 Sprays to each nostril as directed every 6 hours as needed.   ? tamsulosin (FLOMAX) 0.4 mg capsule Take 1 Cap by mouth daily. Take 30 min after same meal.  Do not cut/ crush/ chew.     Vitals:    04/23/19 1144   BP: (!) 141/69   Pulse: 71   Weight: 123.7 kg (272 lb 12.8 oz)   Height: 185.4 cm (73)   PainSc: Zero     Body mass index is 35.99 kg/m?Marland Kitchen     Physical Exam  Vitals signs and nursing note reviewed.   Constitutional:       General: He is not in acute distress.  Appearance: Normal appearance.   HENT:      Head: Normocephalic and atraumatic.   Eyes:      Extraocular Movements: Extraocular movements intact.   Cardiovascular:      Rate and Rhythm: Normal rate and regular rhythm.      Pulses: Normal pulses.      Heart sounds: Normal heart sounds.   Pulmonary:      Effort: Pulmonary effort is normal.      Breath sounds: Normal breath sounds.   Abdominal:      General: Bowel sounds are normal.      Palpations: Abdomen is soft.   Skin:     General: Skin is warm.      Capillary Refill: Capillary refill takes less than 2 seconds.   Neurological:      General: No focal deficit present.      Mental Status: He is alert and oriented to person, place, and time.   Psychiatric:         Mood and Affect: Mood normal.         Behavior: Behavior normal.              Assessment and Plan:  Derrick King is a 68 yo M with PMH OSA, narcolepsy, HLD, arthritis who presents to [facility] for OSA and narcolepsy follow up.     *The patient's Epworth Sleepiness Scale Score is 11/24.  STOPBANG Score: 6  If score > 3 there is a high probability that they have OSA.  Depression Screening was performed on Derrick King in clinic today. Based on the score of 0, no follow up action or recommendations are necessary at this time.**  Problem   Narcolepsy    06/2015 mean latency 7 min, (4.6 on 4 naps), 1 SOREM on 2nd nap  Noted 05/2015 MSLT 18 min, no REM, PSG CPAP 10 cm, REM lat 65 min  ESS of 11 from 20 while on stimulants  Long hx of EDS, failed Nuvigil, Provigil, Adderall (80 mg),Amphetamine lingual dyskinesias, myoclonic jerks/tics  No sleep paralysis or hallucinations. Denies cataplexy but knees weak at times in the afternoon  No family hx    Noted 05/2015 MSLT 18 min, no REM, PSG CPAP 10 cm, REM lat 65 min, No sleep paralysis or hallucinations. Denies cataplexy but knees weak at times in the afternoon.No family hx of Narcolepsy.    -ESS of 20-->11 while on stimulants  - Long hx of EDS, failed Nuvigil, Provigil, Adderall (80 mg),Amphetamine lingual dyskinesias, myoclonic jerks/tics--> some improvement on Concerta (Methyphenidate CR 36mg  BID )        Obesity    Discussed patient's BMI with him.  The body mass index is 34.84 kg/(m^2). and falls within the category of Obesity 2 (35 to <40); counseled regarding weight gain.     Osa On Cpap    Diagnosed in 2011  Titration study in 2014 with AHI of 79, Sleep efficeincy of 82%, REM latency 93 mins, No PLMD's  Titrated to 10 cm H20    Machine: [employer]kel   DME: [facility] in [place]          OSA on CPAP  - Continue current settings CPAP 10 cmH20 as patient getting good benefit.   - Body mass index is 35.99 kg/m?Marland Kitchen Weight loss should be pursued in the long term management of this patient's sleep disordered breathing. Educated on lifestyle modifications    CPAP Maintenance  Change the mask every 3-6 months Change the  hose once a year  Change the filters once a month  Wash the mask, hose and humidifier every day with soap water or vinegar with water.              Obesity  - Body mass index is 35.99 kg/m?Marland Kitchen Educated on lifestyle modifications.     Narcolepsy  - Add Nuvigil 250mg  PO daily. Discussed risks/benefits/SE/alternatives.  - Continue Dextrostat 20mg  PO TID for now. Discussed risks/benefits/SE/alternatives.  - Advised to avoid alcohol and sedatives. As well, advised to avoid driving or operating heavy machinery if sleepy. Patient voiced understanding.       RTC in 6 months for follow up.     Patient seen and discussed with Dr. Hattie Perch, MD  Sleep Medicine Fellow

## 2019-04-23 NOTE — Assessment & Plan Note
-   Body mass index is 35.99 kg/m. Educated on lifestyle modifications.

## 2019-06-23 ENCOUNTER — Encounter: Admit: 2019-06-23 | Discharge: 2019-06-23 | Payer: MEDICARE

## 2019-06-24 MED ORDER — DEXTROAMPHETAMINE 10 MG PO TAB
ORAL_TABLET | Freq: Three times a day (TID) | 0 refills | Status: AC
Start: 2019-06-24 — End: ?

## 2019-06-24 MED ORDER — DEXTROAMPHETAMINE 10 MG PO TAB
ORAL_TABLET | Freq: Three times a day (TID) | ORAL | 0 refills | 30.00000 days | Status: AC
Start: 2019-06-24 — End: ?

## 2019-08-14 ENCOUNTER — Encounter: Admit: 2019-08-14 | Discharge: 2019-08-14 | Payer: MEDICARE

## 2019-08-14 MED ORDER — DEXTROAMPHETAMINE 10 MG PO TAB
ORAL_TABLET | Freq: Three times a day (TID) | 0 refills | Status: DC
Start: 2019-08-14 — End: 2019-11-08

## 2019-08-14 MED ORDER — DEXTROAMPHETAMINE 10 MG PO TAB
ORAL_TABLET | Freq: Three times a day (TID) | 0 refills | Status: AC
Start: 2019-08-14 — End: ?

## 2019-08-14 NOTE — Telephone Encounter
Phone call from pt requesting medications be sent to a different pharmacy. Dextrostat cancelled at [facility]. To resend to new pharmacy once Dr Andria Meuse approves medication.

## 2019-11-08 ENCOUNTER — Encounter: Admit: 2019-11-08 | Discharge: 2019-11-08 | Payer: MEDICARE

## 2019-11-08 DIAGNOSIS — G43909 Migraine, unspecified, not intractable, without status migrainosus: Secondary | ICD-10-CM

## 2019-11-08 DIAGNOSIS — H9193 Unspecified hearing loss, bilateral: Secondary | ICD-10-CM

## 2019-11-08 DIAGNOSIS — E785 Hyperlipidemia, unspecified: Secondary | ICD-10-CM

## 2019-11-08 DIAGNOSIS — G4733 Obstructive sleep apnea (adult) (pediatric): Secondary | ICD-10-CM

## 2019-11-08 DIAGNOSIS — R0600 Dyspnea, unspecified: Secondary | ICD-10-CM

## 2019-11-08 DIAGNOSIS — R079 Chest pain, unspecified: Secondary | ICD-10-CM

## 2019-11-08 DIAGNOSIS — K219 Gastro-esophageal reflux disease without esophagitis: Secondary | ICD-10-CM

## 2019-11-08 DIAGNOSIS — N200 Calculus of kidney: Secondary | ICD-10-CM

## 2019-11-08 DIAGNOSIS — R1319 Other dysphagia: Secondary | ICD-10-CM

## 2019-11-08 DIAGNOSIS — D689 Coagulation defect, unspecified: Secondary | ICD-10-CM

## 2019-11-08 DIAGNOSIS — G47419 Narcolepsy without cataplexy: Secondary | ICD-10-CM

## 2019-11-08 DIAGNOSIS — R519 Generalized headaches: Secondary | ICD-10-CM

## 2019-11-08 DIAGNOSIS — E6609 Other obesity due to excess calories: Secondary | ICD-10-CM

## 2019-11-08 DIAGNOSIS — F329 Major depressive disorder, single episode, unspecified: Secondary | ICD-10-CM

## 2019-11-08 DIAGNOSIS — G479 Sleep disorder, unspecified: Secondary | ICD-10-CM

## 2019-11-08 DIAGNOSIS — R42 Dizziness and giddiness: Secondary | ICD-10-CM

## 2019-11-08 DIAGNOSIS — M1990 Unspecified osteoarthritis, unspecified site: Secondary | ICD-10-CM

## 2019-11-08 MED ORDER — DEXTROAMPHETAMINE 10 MG PO TAB
10-30 mg | ORAL_TABLET | Freq: Three times a day (TID) | ORAL | 0 refills | Status: AC
Start: 2019-11-08 — End: ?

## 2019-11-08 MED ORDER — ESCITALOPRAM OXALATE 10 MG PO TAB
ORAL_TABLET | Freq: Every day | 11 refills | Status: AC
Start: 2019-11-08 — End: ?

## 2019-11-08 NOTE — Assessment & Plan Note
Qualifying diagnosis is OSA  Compliance report has been reviewed by me.  The patient is using the CPAP device as directed.  Patient is benefiting from the prescribed CPAP.  Patient has a continued need for CPAP device.

## 2019-11-08 NOTE — Assessment & Plan Note
Estimated body mass index is 35.99 kg/m as calculated from the following:    Height as of 04/23/19: 185.4 cm (73").    Weight as of 04/23/19: 123.7 kg (272 lb 12.8 oz).  Refer back to PCP for weight management.  The patient is aware of the association between obesity and sleep apnea.

## 2019-11-08 NOTE — Assessment & Plan Note
Lexapro 15 mg - the patient is benefiting with no side effects.  Continue present management.  I reviewed this script.

## 2019-11-08 NOTE — Assessment & Plan Note
Was exposed to loud noise last year and appears hearing is not as good.  He wants to get a hearing assist place.

## 2019-11-09 ENCOUNTER — Encounter: Admit: 2019-11-09 | Discharge: 2019-11-09 | Payer: MEDICARE

## 2019-11-09 NOTE — Progress Notes
Application for [employer] completed, signed, and faxed to [employer].

## 2019-11-09 NOTE — Telephone Encounter
Waiting on notes to submit prior authorization for [employer]

## 2020-01-02 ENCOUNTER — Encounter: Admit: 2020-01-02 | Discharge: 2020-01-02 | Payer: MEDICARE

## 2020-02-01 ENCOUNTER — Encounter: Admit: 2020-02-01 | Discharge: 2020-02-01 | Payer: MEDICARE

## 2020-02-01 MED ORDER — DEXTROAMPHETAMINE 10 MG PO TAB
ORAL_TABLET | Freq: Three times a day (TID) | 0 refills | Status: AC
Start: 2020-02-01 — End: ?

## 2020-02-01 NOTE — Telephone Encounter
Phone call from patient requesting refill on Dextrostat. Marland Kitchen Phone call to [facility] and spoke with pharmacist and patient not filling there anymore.

## 2020-02-07 IMAGING — NM NM MYOCAR MULTI W/SPECT W/WALL MOTION & EF
2 series · 12 of 12 positions shown · non-contrast
Comparison: none

[Series 1: rest · 6.51mm/px · 6 of 64 frames shown]
[frame 6/64]
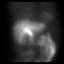
[frame 16/64]
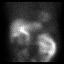
[frame 27/64]
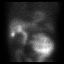
[frame 38/64]
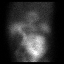
[frame 48/64]
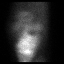
[frame 59/64]
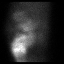

[Series 3: stress gated - perfusion · 6.51mm/px · 6 of 64 frames shown]
[frame 6/64]
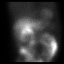
[frame 16/64]
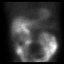
[frame 27/64]
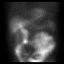
[frame 38/64]
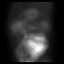
[frame 48/64]
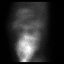
[frame 59/64]
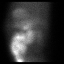

[12 of 12 positions shown; findings below may reference images not displayed]

Canned report from images found in remote index.

Refer to host system for actual result text.

## 2020-02-23 IMAGING — DX DG CHEST 1V PORT
1 series · 1 of 1 positions shown · non-contrast
Comparison: 03/01/2004 and chest CT from 10/25/2017

CLINICAL DATA: Abdominal aortic aneurysm

EXAM:
PORTABLE CHEST 1 VIEW

[chest ap]
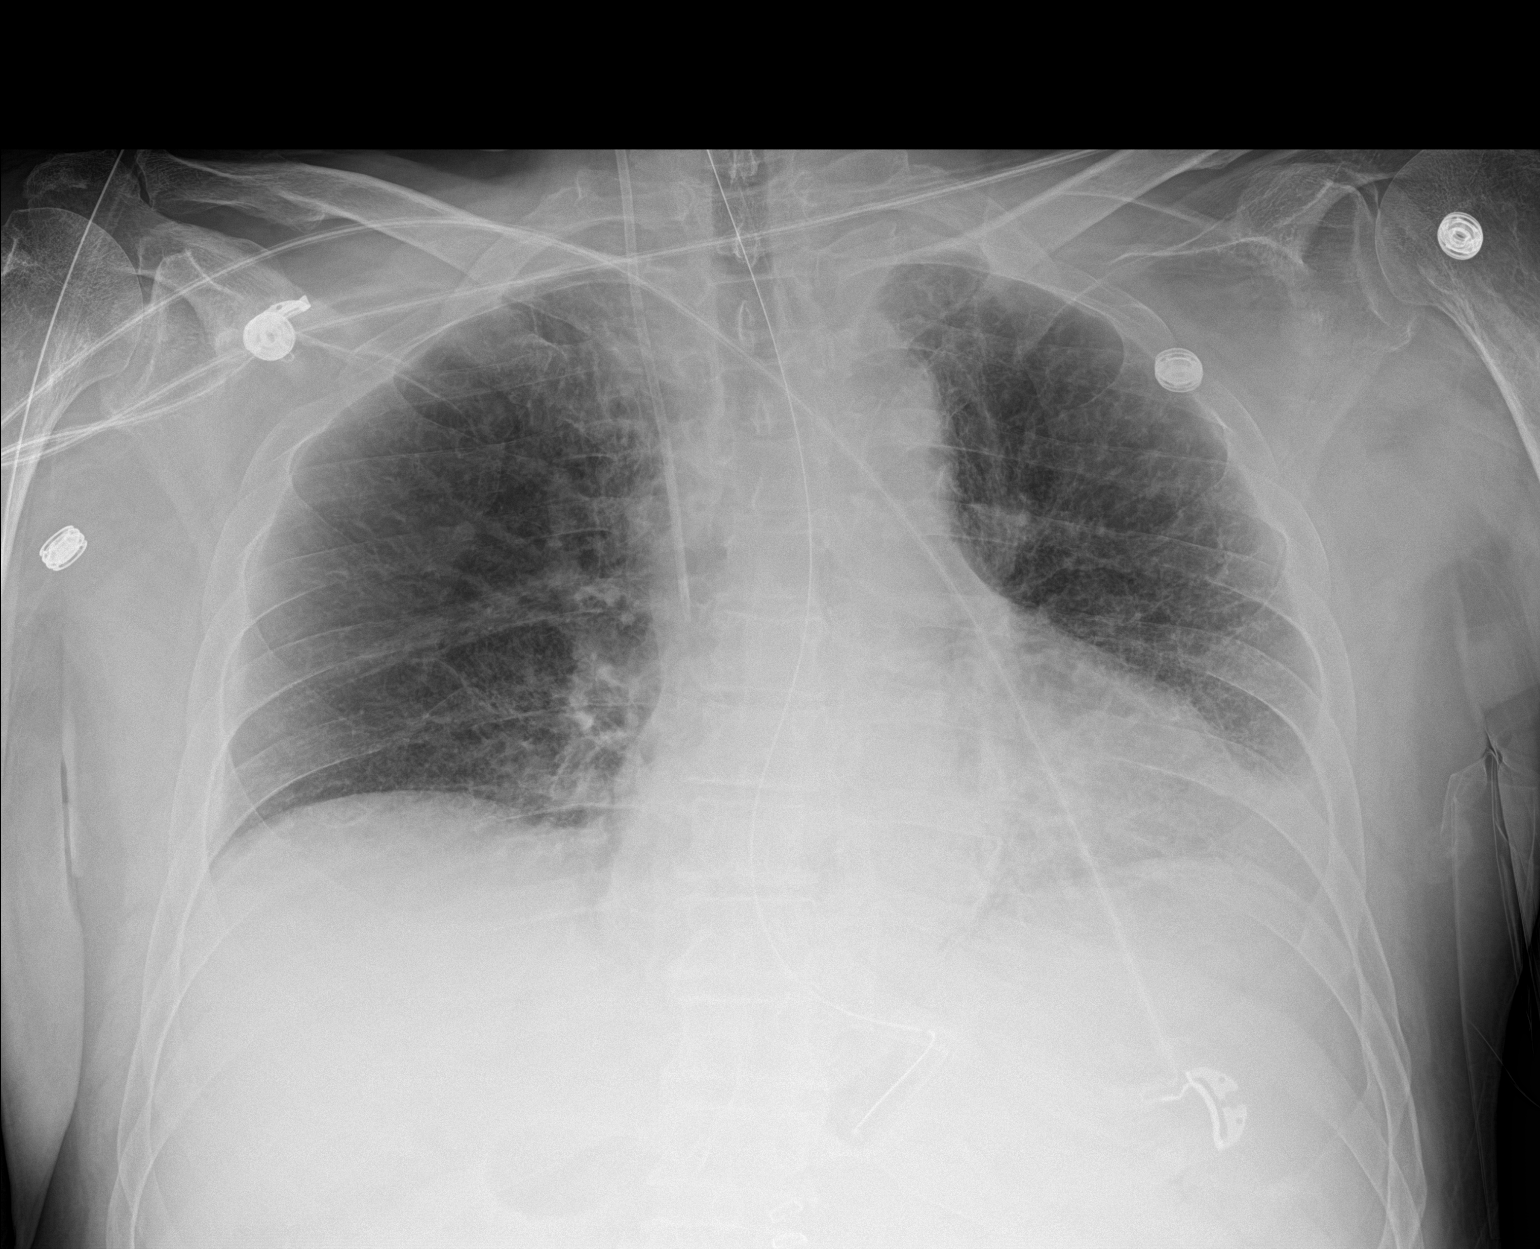

[1 of 1 positions shown; findings below may reference images not displayed]

FINDINGS: Borderline cardiomegaly with aortic atherosclerosis at the arch and
along the descending aorta. Low lung volumes with slight crowding of
interstitial lung markings. Fine reticular lung markings consistent
with patient's known centrilobular and paraseptal emphysematous
change. 5 mm rounded density in the right mid lung likely represents
a pulmonary vessel seen on end given lack of pulmonary nodule on
recent chest CT. No alveolar consolidation is identified. Hazy
opacity at the left lung base may represent a small left effusion
versus atelectasis. Right IJ central line catheter is seen in the
distal SVC. Gastric tube tip and side-port extend into the expected
location of the stomach, below the left hemidiaphragm.
IMPRESSION: 1. Low lung volumes with fine reticular interstitial lung markings
compatible with changes of the patient's known centrilobular and
paraseptal emphysema.
2. Borderline cardiomegaly with aortic atherosclerosis.
3. Satisfactory support line and tube positions.

## 2020-02-23 IMAGING — DX DG ABD PORTABLE 1V
1 series · 1 of 1 positions shown · non-contrast
Comparison: 11/04/2017 CT

CLINICAL DATA: Abdominal aortic aneurysm

EXAM:
PORTABLE ABDOMEN - 1 VIEW

[abdomen kub]
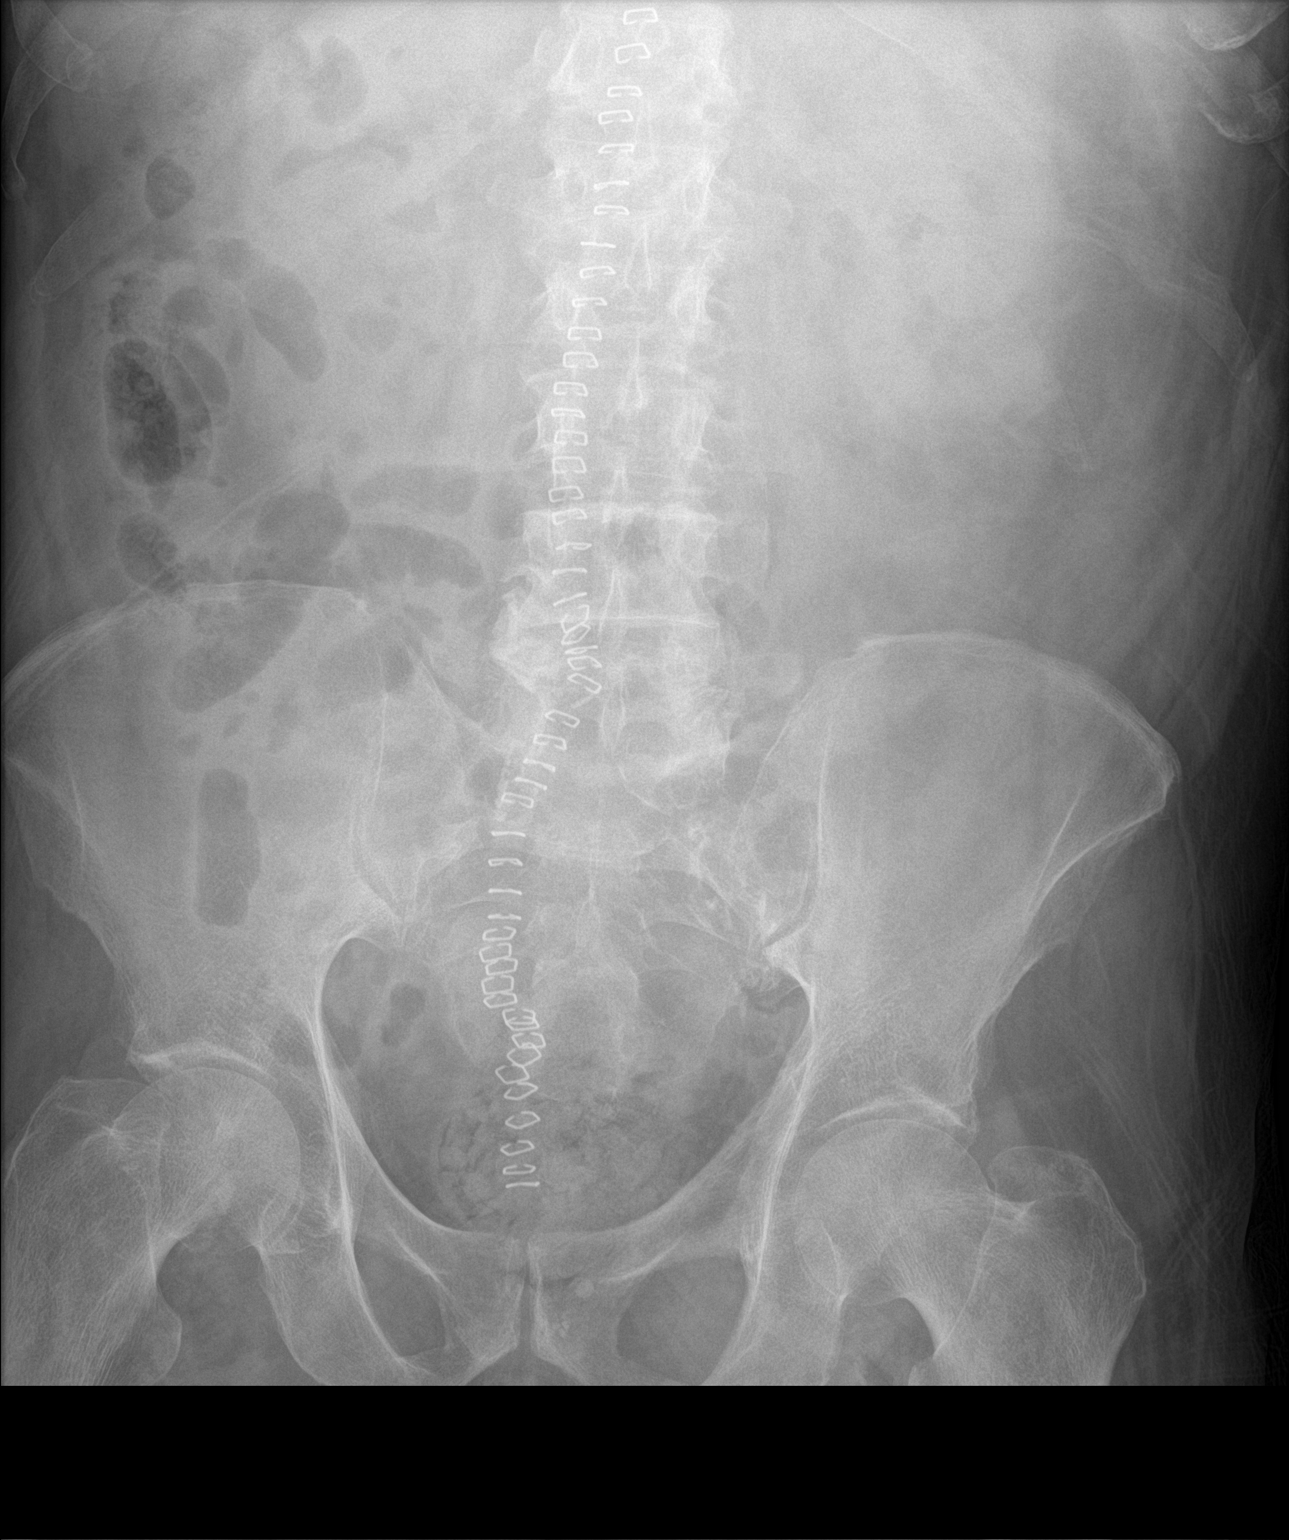

[1 of 1 positions shown; findings below may reference images not displayed]

FINDINGS: The bowel gas pattern is normal. Midline skin staples from L2 caudad
to the lower pelvis. No radio-opaque calculi. Mild degenerative
changes along the included thoracic and lumbar spine.
IMPRESSION: Skin staples project over the mid and lower abdomen extending into
the pelvis. Bowel gas pattern is unremarkable. There is
thoracolumbar degenerative change.

## 2020-02-25 IMAGING — DX DG CHEST 1V PORT
1 series · 1 of 1 positions shown · non-contrast
Comparison: 11/30/2017 and earlier.

CLINICAL DATA: 66-year-old male status post intubation.
Postoperative day 2 status post vascular surgery for AAA repair.
Postop ATN, acute pulmonary edema and hypoxic respiratory failure.

EXAM:
PORTABLE CHEST 1 VIEW

[chest]
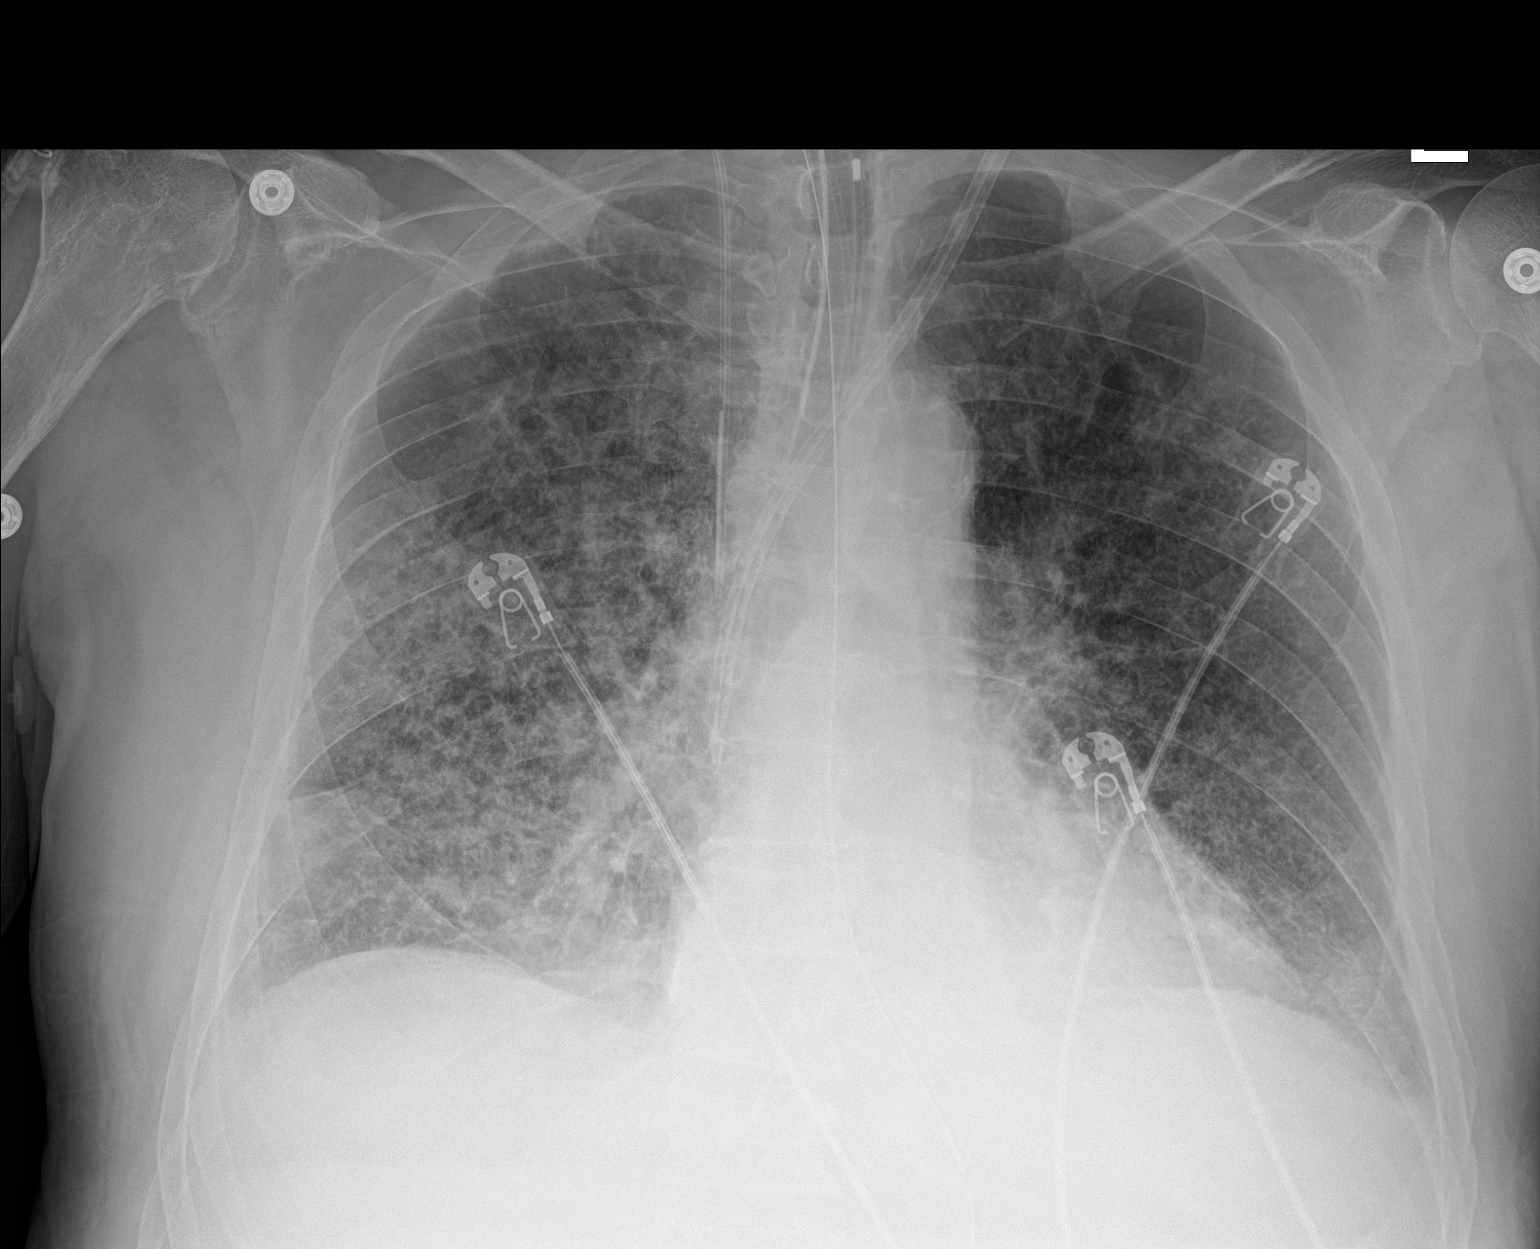

[1 of 1 positions shown; findings below may reference images not displayed]

FINDINGS: Portable AP semi upright view at 1131 hours. Endotracheal tube tip
in good position just below the clavicles. Stable right IJ central
line. A left IJ approach dual lumen dialysis type catheter has been
placed, tip at the level of the lower SVC just above the expected
location of the cavoatrial junction.. Stable enteric tube, side hole
up the level of the gastric cardia.

Larger lung volumes with increased, asymmetric bilateral diffuse
reticulonodular opacity. Mediastinal contours remain normal. No
pneumothorax or pleural effusion. No consolidation.
IMPRESSION: 1. ET tube in good position. Left IJ approach dialysis type catheter
placed, tips at the lower SVC level.
2. Acute asymmetric pulmonary reticulonodular opacity compatible
with asymmetric interstitial edema. Viral/atypical infection is the
main differential consideration.
3. No pneumothorax or other acute cardiopulmonary abnormality.

## 2020-05-12 ENCOUNTER — Encounter: Admit: 2020-05-12 | Discharge: 2020-05-12 | Payer: MEDICARE

## 2020-05-12 ENCOUNTER — Ambulatory Visit: Admit: 2020-05-12 | Discharge: 2020-05-13 | Payer: MEDICARE

## 2020-05-12 DIAGNOSIS — D689 Coagulation defect, unspecified: Secondary | ICD-10-CM

## 2020-05-12 DIAGNOSIS — R0600 Dyspnea, unspecified: Secondary | ICD-10-CM

## 2020-05-12 DIAGNOSIS — G479 Sleep disorder, unspecified: Secondary | ICD-10-CM

## 2020-05-12 DIAGNOSIS — K219 Gastro-esophageal reflux disease without esophagitis: Secondary | ICD-10-CM

## 2020-05-12 DIAGNOSIS — G4733 Obstructive sleep apnea (adult) (pediatric): Secondary | ICD-10-CM

## 2020-05-12 DIAGNOSIS — G47419 Narcolepsy without cataplexy: Secondary | ICD-10-CM

## 2020-05-12 DIAGNOSIS — R1319 Other dysphagia: Secondary | ICD-10-CM

## 2020-05-12 DIAGNOSIS — G43909 Migraine, unspecified, not intractable, without status migrainosus: Secondary | ICD-10-CM

## 2020-05-12 DIAGNOSIS — R079 Chest pain, unspecified: Secondary | ICD-10-CM

## 2020-05-12 DIAGNOSIS — E785 Hyperlipidemia, unspecified: Secondary | ICD-10-CM

## 2020-05-12 DIAGNOSIS — R42 Dizziness and giddiness: Secondary | ICD-10-CM

## 2020-05-12 DIAGNOSIS — N200 Calculus of kidney: Secondary | ICD-10-CM

## 2020-05-12 DIAGNOSIS — M1990 Unspecified osteoarthritis, unspecified site: Secondary | ICD-10-CM

## 2020-05-12 DIAGNOSIS — R519 Generalized headaches: Secondary | ICD-10-CM

## 2020-05-12 MED ORDER — DEXTROAMPHETAMINE 10 MG PO TAB
ORAL_TABLET | Freq: Three times a day (TID) | 0 refills | Status: AC
Start: 2020-05-12 — End: ?

## 2020-05-12 NOTE — Progress Notes
Date of Service: 05/12/2020    Subjective:             Dominique Calvey is a 69 y.o. male.    History of Present Illness  In person    Family in [place] having Covid, brother (hospitalized days)  and cousin (legs collapsed).    2 weeks felt bad.  Went to dentist and had infections, abx given.  Maybe feels a bit better.    Dexedrine 10 mg 6 per day.    Caffeine 100 mg with amphetamines in pill.    Driving is going OK with meds.    Lexapro 15 mg - went through a time of feeling more emotional, crying to TV commercials.      Booth at craft show.    Pocket knives.           Review of Systems   Constitutional: Positive for fatigue.   Respiratory: Positive for apnea.    Neurological: Positive for dizziness, tremors and light-headedness.   Psychiatric/Behavioral: Positive for sleep disturbance.   All other systems reviewed and are negative.        Objective:         ? ascorbic acid (VITAMIN C PO) Take  by mouth.   ? aspirin/acetaminophen/caffeine(+) (EXCEDRIN MIGRAINE) 250/250/65 mg tab Take 1 Tab by mouth every 8 hours as needed.   ? atorvastatin (LIPITOR) 20 mg tablet Take one tablet by mouth daily. at Bedtime   ? Cholecalciferol (Vitamin D3) (VITAMIN D-3) 2,000 unit cap Take 2,000 Units by mouth daily.   ? dextroamphetamine (DEXTROSTAT) 10 mg tablet Indications: recurring sleep episodes during the day  3 po tid (total 9 per day)   ? [START ON 06/09/2020] dextroamphetamine (DEXTROSTAT) 10 mg tablet Indications: recurring sleep episodes during the day  3 po tid (total 9 per day)   ? [START ON 07/07/2020] dextroamphetamine (DEXTROSTAT) 10 mg tablet Indications: recurring sleep episodes during the day  3 po tid (total 9 per day)   ? dextroamphetamine (DEXTROSTAT) 10 mg tablet Indications: recurring sleep episodes during the day  2 po tid   ? dextroamphetamine (DEXTROSTAT) 10 mg tablet Indications: recurring sleep episodes during the day  2 po tid   ? dextroamphetamine (DEXTROSTAT) 10 mg tablet Take one tablet to three tablets by mouth three times daily Indications: recurring sleep episodes during the day   ? dextroamphetamine (DEXTROSTAT) 10 mg tablet Indications: recurring sleep episodes during the day  2 po tid   ? dextroamphetamine (DEXTROSTAT) 10 mg tablet Indications: recurring sleep episodes during the day  2 po tid   ? dextroamphetamine (DEXTROSTAT) 10 mg tablet Indications: recurring sleep episodes during the day  2 po tid   ? dextroamphetamine (DEXTROSTAT) 10 mg tablet Indications: recurring sleep episodes during the day  2 po tid   ? dextroamphetamine (DEXTROSTAT) 10 mg tablet Indications: recurring sleep episodes during the day  2 po tid   ? escitalopram oxalate (LEXAPRO) 10 mg tablet TAKE 1 & 1/2 (ONE & ONE-HALF) TABLETS BY MOUTH ONCE DAILY   ? LORATADINE/PSEUDOEPHEDRINE (CLARITIN-D 24 HOUR PO) Take 1 tablet by mouth as Needed.   ? montelukast (SINGULAIR) 10 mg tablet Take 10 mg by mouth at bedtime daily.   ? omeprazole DR(+) (PRILOSEC) 20 mg PO capsule Take 20 mg by mouth twice daily (at 10AM and 10PM).   ? phenylephrine (NEO-SYNEPHRINE) 1 % spry nasal spray Apply 2 Sprays to each nostril as directed every 6 hours as needed.   ? tamsulosin (  FLOMAX) 0.4 mg capsule Take 1 Cap by mouth daily. Take 30 min after same meal.  Do not cut/ crush/ chew.   ? vitamins, B complex tab Take 1 tablet by mouth daily.   ? Zinc 50 mg tab Take  by mouth.     Vitals:    05/12/20 1046   BP: (!) 145/60   Pulse: 75   Weight: 120.2 kg (265 lb)   Height: 185.4 cm (73)   PainSc: Zero     Body mass index is 34.96 kg/m?Marland Kitchen     Physical Exam  Alert.  Fluent.  Good mood.  NAD.         Assessment and Plan:  **The patient's Epworth Sleepiness Scale Score is 12/24.     If score > 3 there is a high probability that they have OSA.  Depression Screening was performed on Tavio Majka in clinic today. Based on the score of 0, no follow up action or recommendations are necessary at this time   Estimated body mass index is 34.96 kg/m? as calculated from the following:    Height as of this encounter: 185.4 cm (73).    Weight as of this encounter: 120.2 kg (265 lb).  Refer back to PCP for weight management.  The patient is aware of the association between obesity and sleep apnea.  .*  Problem   Narcolepsy    06/2015 mean latency 7 min, (4.6 on 4 naps), 1 SOREM on 2nd nap  Noted 05/2015 MSLT 18 min, no REM, PSG CPAP 10 cm, REM lat 65 min  ESS of 11 from 20 while on stimulants  Long hx of EDS, failed Nuvigil, Provigil, Adderall (80 mg),Amphetamine lingual dyskinesias, myoclonic jerks/tics  No sleep paralysis or hallucinations. Denies cataplexy but knees weak at times in the afternoon  No family hx    Noted 05/2015 MSLT 18 min, no REM, PSG CPAP 10 cm, REM lat 65 min, No sleep paralysis or hallucinations. Denies cataplexy but knees weak at times in the afternoon.No family hx of Narcolepsy.    -ESS of 20-->11 while on stimulants  - Long hx of EDS, failed Nuvigil, Provigil, Adderall (80 mg),Amphetamine lingual dyskinesias, myoclonic jerks/tics--> some improvement on Concerta (Methyphenidate CR 36mg  BID )        Osa On Cpap    Diagnosed in 2011  Titration study in 2014 with AHI of 79, Sleep efficeincy of 82%, REM latency 93 mins, No PLMD's  Titrated to 10 cm H20    Machine: [employer]   DME: [facility] (they live [place] miles away) [place]. [place].  2021 eligible for new machine.          OSA on CPAP  Qualifying diagnosis is OSA  Compliance report has been reviewed by me.  The patient is using the CPAP device as directed.  Patient is benefiting from the prescribed CPAP.  Patient has a continued need for CPAP device.      Wonders about increasing pressure due to sinuses swollen shut.      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    Requests CPAP 12 cm H20 (up from 10) due to sinus pressure.    Eligible for new machine.      Narcolepsy  Dexedrine 20-30 mg tid depending on the day.  Will write for 10 mg up to 3 tabs 3 times per day, total 9 tabs per day.  No high heart rate ever noted.  Interested in [place] or [place] if patient assistance could help.

## 2020-08-22 ENCOUNTER — Encounter: Admit: 2020-08-22 | Discharge: 2020-08-22 | Payer: MEDICARE

## 2020-08-22 MED ORDER — DEXTROAMPHETAMINE 10 MG PO TAB
30 mg | ORAL_TABLET | Freq: Three times a day (TID) | ORAL | 0 refills | Status: AC
Start: 2020-08-22 — End: ?

## 2020-08-22 NOTE — Telephone Encounter
OSA on CPAP  Qualifying diagnosis is OSA  Compliance report has been reviewed by me.  The patient is using the CPAP device as directed.  Patient is benefiting from the prescribed CPAP.  Patient has a continued need for CPAP device.      Wonders about increasing pressure due to sinuses swollen shut.      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    Requests CPAP 12 cm H20 (up from 10) due to sinus pressure.    Eligible for new machine.    Narcolepsy  Dexedrine 20-30 mg tid depending on the day.  Will write for 10 mg up to 3 tabs 3 times per day, total 9 tabs per day.  No high heart rate ever noted.    Interested in [place] or [place] if patient assistance could help.

## 2020-10-02 ENCOUNTER — Encounter: Admit: 2020-10-02 | Discharge: 2020-10-02 | Payer: MEDICARE

## 2020-10-24 ENCOUNTER — Encounter: Admit: 2020-10-24 | Discharge: 2020-10-24 | Payer: MEDICARE

## 2021-01-06 ENCOUNTER — Encounter: Admit: 2021-01-06 | Discharge: 2021-01-06 | Payer: MEDICARE

## 2021-01-06 DIAGNOSIS — G47419 Narcolepsy without cataplexy: Secondary | ICD-10-CM

## 2021-01-06 MED ORDER — DEXTROAMPHETAMINE SULFATE 10 MG PO TAB
30 mg | ORAL_TABLET | Freq: Three times a day (TID) | ORAL | 0 refills | Status: AC
Start: 2021-01-06 — End: ?

## 2021-04-15 ENCOUNTER — Encounter: Admit: 2021-04-15 | Discharge: 2021-04-15 | Payer: MEDICARE

## 2021-04-29 ENCOUNTER — Encounter: Admit: 2021-04-29 | Discharge: 2021-04-29 | Payer: MEDICARE

## 2023-07-21 ENCOUNTER — Encounter: Admit: 2023-07-21 | Discharge: 2023-07-21 | Payer: MEDICARE
